# Patient Record
Sex: Female | Born: 1950 | Race: White | Hispanic: No | Marital: Married
Health system: Southern US, Community
[De-identification: ages and names within clinical notes are randomized; demographics above are authoritative.]

## PROBLEM LIST (undated history)

## (undated) DIAGNOSIS — G20 Parkinson's disease: Secondary | ICD-10-CM

## (undated) DIAGNOSIS — C801 Malignant (primary) neoplasm, unspecified: Secondary | ICD-10-CM

## (undated) DIAGNOSIS — F0280 Dementia in other diseases classified elsewhere without behavioral disturbance: Secondary | ICD-10-CM

## (undated) HISTORY — DX: Malignant (primary) neoplasm, unspecified: C80.1

## (undated) HISTORY — PX: BREAST LUMPECTOMY: SHX2

---

## 2000-08-01 ENCOUNTER — Other Ambulatory Visit: Admission: RE | Admit: 2000-08-01 | Discharge: 2000-08-01 | Payer: Self-pay | Admitting: *Deleted

## 2000-08-09 ENCOUNTER — Encounter: Payer: Self-pay | Admitting: Internal Medicine

## 2000-08-09 ENCOUNTER — Encounter: Admission: RE | Admit: 2000-08-09 | Discharge: 2000-08-09 | Payer: Self-pay | Admitting: Internal Medicine

## 2001-09-06 ENCOUNTER — Ambulatory Visit (HOSPITAL_COMMUNITY): Admission: RE | Admit: 2001-09-06 | Discharge: 2001-09-06 | Payer: Self-pay | Admitting: Internal Medicine

## 2001-09-06 ENCOUNTER — Encounter: Payer: Self-pay | Admitting: Internal Medicine

## 2001-09-10 ENCOUNTER — Encounter: Payer: Self-pay | Admitting: Internal Medicine

## 2001-09-10 ENCOUNTER — Encounter (INDEPENDENT_AMBULATORY_CARE_PROVIDER_SITE_OTHER): Payer: Self-pay | Admitting: *Deleted

## 2001-09-10 ENCOUNTER — Encounter: Admission: RE | Admit: 2001-09-10 | Discharge: 2001-09-10 | Payer: Self-pay | Admitting: Internal Medicine

## 2001-10-02 ENCOUNTER — Encounter: Admission: RE | Admit: 2001-10-02 | Discharge: 2001-10-02 | Payer: Self-pay | Admitting: Surgery

## 2001-10-02 ENCOUNTER — Encounter: Payer: Self-pay | Admitting: Surgery

## 2001-10-04 ENCOUNTER — Ambulatory Visit (HOSPITAL_BASED_OUTPATIENT_CLINIC_OR_DEPARTMENT_OTHER): Admission: RE | Admit: 2001-10-04 | Discharge: 2001-10-04 | Payer: Self-pay | Admitting: Surgery

## 2001-10-04 ENCOUNTER — Encounter: Payer: Self-pay | Admitting: Surgery

## 2001-10-04 ENCOUNTER — Encounter: Admission: RE | Admit: 2001-10-04 | Discharge: 2001-10-04 | Payer: Self-pay | Admitting: Surgery

## 2001-10-04 ENCOUNTER — Encounter (INDEPENDENT_AMBULATORY_CARE_PROVIDER_SITE_OTHER): Payer: Self-pay | Admitting: Specialist

## 2001-10-11 ENCOUNTER — Ambulatory Visit: Admission: RE | Admit: 2001-10-11 | Discharge: 2001-12-29 | Payer: Self-pay | Admitting: Radiation Oncology

## 2001-10-16 ENCOUNTER — Other Ambulatory Visit: Admission: RE | Admit: 2001-10-16 | Discharge: 2001-10-16 | Payer: Self-pay | Admitting: Obstetrics and Gynecology

## 2002-02-15 ENCOUNTER — Ambulatory Visit (HOSPITAL_COMMUNITY): Admission: RE | Admit: 2002-02-15 | Discharge: 2002-02-15 | Payer: Self-pay | Admitting: Gastroenterology

## 2002-09-11 ENCOUNTER — Encounter: Admission: RE | Admit: 2002-09-11 | Discharge: 2002-09-11 | Payer: Self-pay | Admitting: Surgery

## 2002-09-11 ENCOUNTER — Encounter: Payer: Self-pay | Admitting: Surgery

## 2003-01-03 ENCOUNTER — Other Ambulatory Visit: Admission: RE | Admit: 2003-01-03 | Discharge: 2003-01-03 | Payer: Self-pay | Admitting: Obstetrics and Gynecology

## 2003-09-19 ENCOUNTER — Encounter: Admission: RE | Admit: 2003-09-19 | Discharge: 2003-09-19 | Payer: Self-pay | Admitting: Surgery

## 2004-08-05 ENCOUNTER — Encounter: Admission: RE | Admit: 2004-08-05 | Discharge: 2004-08-05 | Payer: Self-pay | Admitting: Surgery

## 2005-09-13 ENCOUNTER — Encounter: Admission: RE | Admit: 2005-09-13 | Discharge: 2005-09-13 | Payer: Self-pay | Admitting: Surgery

## 2006-11-21 ENCOUNTER — Encounter: Admission: RE | Admit: 2006-11-21 | Discharge: 2006-11-21 | Payer: Self-pay | Admitting: Surgery

## 2007-11-26 ENCOUNTER — Encounter: Admission: RE | Admit: 2007-11-26 | Discharge: 2007-11-26 | Payer: Self-pay | Admitting: Surgery

## 2009-07-20 ENCOUNTER — Encounter: Admission: RE | Admit: 2009-07-20 | Discharge: 2009-07-20 | Payer: Self-pay | Admitting: Internal Medicine

## 2010-07-16 NOTE — Op Note (Signed)
   NAME:  Kellie Moore, FRONEK                            ACCOUNT NO.:  [id number]   MEDICAL RECORD NO.:  [id number]                   PATIENT TYPE:  AMB   LOCATION:  ENDO                                 FACILITY:  [facility]   PHYSICIAN:  Bernette Redbird, M.D.                DATE OF BIRTH:  Aug 18, 1950   DATE OF PROCEDURE:  02/15/2002  DATE OF DISCHARGE:                                 OPERATIVE REPORT   PROCEDURE:  Colonoscopy.   INDICATION:  A 60 year old female with history of breast cancer,  asymptomatic from the GI tract standpoint, for screening colonoscopy.   FINDINGS:  Normal exam to the cecum.   DESCRIPTION OF PROCEDURE:  The nature, purpose, and risks of the procedure  had been discussed with the patient who provided written consent.  Sedation  was fentanyl 100 mcg and Versed 9 mg IV without arrhythmias or desaturation.  The Olympus adjustable-tension pediatric video colonoscope was advanced with  some looping, ultimately reaching the cecum as identified by typical cecal  appearance.  Pullback was then performed.  The quality of the prep was very  good, although there was a little bit of stool film coating in proximal  colon which is not felt likely to have obscured any significant lesions.   This was a normal examination.  No polyps, cancer, colitis, vascular  malformations, or diverticular disease were observed.  Retroflexion in the  rectum was performed but could visualize the distal rectum due to overlying  valves of [place].  Careful antegrade viewing, however, disclosed no distal  rectal lesions.  Reinspection of the rectum and lower sigmoid was  unremarkable.  No biopsies were obtained.  The patient tolerated the  procedure well, and there were no apparent complications.   IMPRESSION:  Normal examination to the cecum, specifically without any  evidence of cancer or polyps.   PLAN:  Follow-up examination in five years (flexible sigmoidoscopy versus  colon) for ongoing colon  cancer screening.                                               Bernette Redbird, M.D.    RB/MEDQ  D:  02/15/2002  T:  02/15/2002  Job:  [id number]   cc:   Candyce Churn, M.D.  [address] [place]  [place] [address]  Fax: [phone]

## 2010-07-16 NOTE — Op Note (Signed)
NAME:  Kellie Moore, Kellie Moore                            ACCOUNT NO.:  [id number]   MEDICAL RECORD NO.:  [id number]                   PATIENT TYPE:  OUT   LOCATION:  MAMO                                 FACILITY:  [facility]   PHYSICIAN:  Currie Paris, M.D.           DATE OF BIRTH:  1950/12/19   DATE OF PROCEDURE:  10/04/2001  DATE OF DISCHARGE:  09/06/2001                                 OPERATIVE REPORT   OFFICE MEDICAL RECORD NUMBER:  [id number]   PREOPERATIVE DIAGNOSIS:  Ductal carcinoma in situ, left breast, lower outer  quadrant.   POSTOPERATIVE DIAGNOSIS:  Ductal carcinoma in situ, left breast, lower outer  quadrant.   OPERATION:  Needle-guided excision of right breast cancer.   SURGEON:  Currie Paris, M.D.   ANESTHESIA:  General (LMA).   CLINICAL HISTORY:  This patient is a 60 year old with a small area of  calcifications noted on mammography and core biopsy showed DCIS.  After a  lengthy discussion with the patient, we elected to proceed to needle-guided  excision.  This was a very small area, and a marking clip had been left with  the patient to help with followup localization.   DESCRIPTION OF PROCEDURE:  The patient was seen in the holding area and had  no further questions.  Films and guide wire placement were noted.  The  patient was taken to the operating room, and after satisfactory general  anesthesia had been obtained, the breast was prepped and draped.  The guide  wire entered in the lower outer quadrant almost at the inframammary fold and  tracked superiorly and medially basically toward the nipple.  I made an  elliptical incision in a radial fashion with the most lateral inferior  aspect being the guide wire and then medially toward the nipple so that I  was going to come down on the guide wire track and excise it in its  entirety.  Using cautery, I took an area of a couple centimeters around the  guide wire going down to the chest wall and to beyond the tip  medially.  We  did identify the tip right at the end of the dissection, but from the  mammograms it appeared that the abnormality was well proximal to that, and I  thought we had it well out.  There was fairly dense breast tissue along the  superior medial edge, and I excised another 1 cm of that just to be sure we  had a good margin there.  The inferior edge really appeared to be more fatty  tissue than in the breast tissue.   The area was infiltrated with some Marcaine to help with postop analgesia.  Bleeders were either coagulated or tied, and everything appeared dry.  I put  some marking clips in to mark the margins of excision and then closed in  layers with 3-0 Vicryl followed by 4-0 Monocryl subcuticular  and Steri-  Strips.  The patient tolerated the procedure well.  There were no operative  complications and all counts were correct.                                              Currie Paris, M.D.   CJS/MEDQ  D:  10/04/2001  T:  10/08/2001  Job:  [id number]   cc:   Johnella Moloney, M.D.

## 2010-12-03 ENCOUNTER — Other Ambulatory Visit: Payer: Self-pay | Admitting: Internal Medicine

## 2010-12-03 DIAGNOSIS — Z1231 Encounter for screening mammogram for malignant neoplasm of breast: Secondary | ICD-10-CM

## 2010-12-20 ENCOUNTER — Ambulatory Visit
Admission: RE | Admit: 2010-12-20 | Discharge: 2010-12-20 | Disposition: A | Discharge status: Home / Self Care | Payer: 59 | Source: Ambulatory Visit | Attending: Internal Medicine | Admitting: Internal Medicine

## 2010-12-20 DIAGNOSIS — Z1231 Encounter for screening mammogram for malignant neoplasm of breast: Secondary | ICD-10-CM

## 2012-08-14 ENCOUNTER — Other Ambulatory Visit: Payer: Self-pay | Admitting: Dermatology

## 2012-09-12 ENCOUNTER — Other Ambulatory Visit: Payer: Self-pay

## 2012-09-12 DIAGNOSIS — Z1231 Encounter for screening mammogram for malignant neoplasm of breast: Secondary | ICD-10-CM

## 2012-10-08 ENCOUNTER — Ambulatory Visit
Admission: RE | Admit: 2012-10-08 | Discharge: 2012-10-08 | Disposition: A | Discharge status: Home / Self Care | Payer: 59 | Source: Ambulatory Visit

## 2012-10-08 DIAGNOSIS — Z1231 Encounter for screening mammogram for malignant neoplasm of breast: Secondary | ICD-10-CM

## 2013-01-14 ENCOUNTER — Other Ambulatory Visit: Payer: Self-pay | Admitting: Gastroenterology

## 2013-12-13 ENCOUNTER — Other Ambulatory Visit: Payer: Self-pay

## 2013-12-13 DIAGNOSIS — Z1231 Encounter for screening mammogram for malignant neoplasm of breast: Secondary | ICD-10-CM

## 2014-01-03 ENCOUNTER — Ambulatory Visit
Admission: RE | Admit: 2014-01-03 | Discharge: 2014-01-03 | Disposition: A | Discharge status: Home / Self Care | Payer: 59 | Source: Ambulatory Visit

## 2014-01-03 DIAGNOSIS — Z1231 Encounter for screening mammogram for malignant neoplasm of breast: Secondary | ICD-10-CM

## 2015-04-15 ENCOUNTER — Other Ambulatory Visit: Payer: Self-pay

## 2015-04-15 DIAGNOSIS — Z1231 Encounter for screening mammogram for malignant neoplasm of breast: Secondary | ICD-10-CM

## 2015-04-15 DIAGNOSIS — Z853 Personal history of malignant neoplasm of breast: Secondary | ICD-10-CM

## 2015-04-30 ENCOUNTER — Ambulatory Visit
Admission: RE | Admit: 2015-04-30 | Discharge: 2015-04-30 | Disposition: A | Discharge status: Home / Self Care | Payer: PPO | Source: Ambulatory Visit

## 2015-04-30 DIAGNOSIS — Z853 Personal history of malignant neoplasm of breast: Secondary | ICD-10-CM

## 2015-04-30 DIAGNOSIS — Z1231 Encounter for screening mammogram for malignant neoplasm of breast: Secondary | ICD-10-CM | POA: Diagnosis not present

## 2015-05-05 ENCOUNTER — Other Ambulatory Visit: Payer: Self-pay | Admitting: Internal Medicine

## 2015-05-05 DIAGNOSIS — R928 Other abnormal and inconclusive findings on diagnostic imaging of breast: Secondary | ICD-10-CM

## 2015-05-11 ENCOUNTER — Ambulatory Visit
Admission: RE | Admit: 2015-05-11 | Discharge: 2015-05-11 | Disposition: A | Discharge status: Home / Self Care | Payer: PPO | Source: Ambulatory Visit | Attending: Internal Medicine | Admitting: Internal Medicine

## 2015-05-11 DIAGNOSIS — N6012 Diffuse cystic mastopathy of left breast: Secondary | ICD-10-CM | POA: Diagnosis not present

## 2015-05-11 DIAGNOSIS — R928 Other abnormal and inconclusive findings on diagnostic imaging of breast: Secondary | ICD-10-CM

## 2015-09-03 DIAGNOSIS — D225 Melanocytic nevi of trunk: Secondary | ICD-10-CM | POA: Diagnosis not present

## 2015-09-03 DIAGNOSIS — D1800 Hemangioma unspecified site: Secondary | ICD-10-CM | POA: Diagnosis not present

## 2015-09-03 DIAGNOSIS — L814 Other melanin hyperpigmentation: Secondary | ICD-10-CM | POA: Diagnosis not present

## 2015-09-03 DIAGNOSIS — Z411 Encounter for cosmetic surgery: Secondary | ICD-10-CM | POA: Diagnosis not present

## 2015-09-03 DIAGNOSIS — Z86018 Personal history of other benign neoplasm: Secondary | ICD-10-CM | POA: Diagnosis not present

## 2015-09-03 DIAGNOSIS — L821 Other seborrheic keratosis: Secondary | ICD-10-CM | POA: Diagnosis not present

## 2015-12-07 DIAGNOSIS — H5213 Myopia, bilateral: Secondary | ICD-10-CM | POA: Diagnosis not present

## 2015-12-07 DIAGNOSIS — H52203 Unspecified astigmatism, bilateral: Secondary | ICD-10-CM | POA: Diagnosis not present

## 2016-02-01 DIAGNOSIS — R7309 Other abnormal glucose: Secondary | ICD-10-CM | POA: Diagnosis not present

## 2016-02-01 DIAGNOSIS — Z1389 Encounter for screening for other disorder: Secondary | ICD-10-CM | POA: Diagnosis not present

## 2016-02-01 DIAGNOSIS — F419 Anxiety disorder, unspecified: Secondary | ICD-10-CM | POA: Diagnosis not present

## 2016-02-01 DIAGNOSIS — E441 Mild protein-calorie malnutrition: Secondary | ICD-10-CM | POA: Diagnosis not present

## 2016-02-01 DIAGNOSIS — Z1159 Encounter for screening for other viral diseases: Secondary | ICD-10-CM | POA: Diagnosis not present

## 2016-02-01 DIAGNOSIS — Z0000 Encounter for general adult medical examination without abnormal findings: Secondary | ICD-10-CM | POA: Diagnosis not present

## 2016-02-01 DIAGNOSIS — C50911 Malignant neoplasm of unspecified site of right female breast: Secondary | ICD-10-CM | POA: Diagnosis not present

## 2016-07-04 DIAGNOSIS — L235 Allergic contact dermatitis due to other chemical products: Secondary | ICD-10-CM | POA: Diagnosis not present

## 2016-07-13 DIAGNOSIS — R21 Rash and other nonspecific skin eruption: Secondary | ICD-10-CM | POA: Diagnosis not present

## 2016-07-13 DIAGNOSIS — L509 Urticaria, unspecified: Secondary | ICD-10-CM | POA: Diagnosis not present

## 2016-07-13 DIAGNOSIS — L503 Dermatographic urticaria: Secondary | ICD-10-CM | POA: Diagnosis not present

## 2016-08-01 DIAGNOSIS — E441 Mild protein-calorie malnutrition: Secondary | ICD-10-CM | POA: Diagnosis not present

## 2016-08-01 DIAGNOSIS — R21 Rash and other nonspecific skin eruption: Secondary | ICD-10-CM | POA: Diagnosis not present

## 2016-08-01 DIAGNOSIS — E782 Mixed hyperlipidemia: Secondary | ICD-10-CM | POA: Diagnosis not present

## 2016-08-01 DIAGNOSIS — F419 Anxiety disorder, unspecified: Secondary | ICD-10-CM | POA: Diagnosis not present

## 2016-08-01 DIAGNOSIS — K635 Polyp of colon: Secondary | ICD-10-CM | POA: Diagnosis not present

## 2016-08-01 DIAGNOSIS — C50911 Malignant neoplasm of unspecified site of right female breast: Secondary | ICD-10-CM | POA: Diagnosis not present

## 2016-08-08 ENCOUNTER — Other Ambulatory Visit: Payer: Self-pay | Admitting: Internal Medicine

## 2016-08-08 DIAGNOSIS — Z1231 Encounter for screening mammogram for malignant neoplasm of breast: Secondary | ICD-10-CM

## 2016-09-01 ENCOUNTER — Ambulatory Visit
Admission: RE | Admit: 2016-09-01 | Discharge: 2016-09-01 | Disposition: A | Discharge status: Home / Self Care | Payer: PPO | Source: Ambulatory Visit | Attending: Internal Medicine | Admitting: Internal Medicine

## 2016-09-01 DIAGNOSIS — Z1231 Encounter for screening mammogram for malignant neoplasm of breast: Secondary | ICD-10-CM

## 2016-09-07 DIAGNOSIS — D225 Melanocytic nevi of trunk: Secondary | ICD-10-CM | POA: Diagnosis not present

## 2016-09-07 DIAGNOSIS — D1801 Hemangioma of skin and subcutaneous tissue: Secondary | ICD-10-CM | POA: Diagnosis not present

## 2016-09-07 DIAGNOSIS — L814 Other melanin hyperpigmentation: Secondary | ICD-10-CM | POA: Diagnosis not present

## 2016-09-07 DIAGNOSIS — L821 Other seborrheic keratosis: Secondary | ICD-10-CM | POA: Diagnosis not present

## 2016-09-07 DIAGNOSIS — Z86018 Personal history of other benign neoplasm: Secondary | ICD-10-CM | POA: Diagnosis not present

## 2016-11-11 DIAGNOSIS — R21 Rash and other nonspecific skin eruption: Secondary | ICD-10-CM | POA: Diagnosis not present

## 2016-11-11 DIAGNOSIS — J3089 Other allergic rhinitis: Secondary | ICD-10-CM | POA: Diagnosis not present

## 2016-11-11 DIAGNOSIS — L299 Pruritus, unspecified: Secondary | ICD-10-CM | POA: Diagnosis not present

## 2016-11-11 DIAGNOSIS — J309 Allergic rhinitis, unspecified: Secondary | ICD-10-CM | POA: Diagnosis not present

## 2016-12-01 ENCOUNTER — Other Ambulatory Visit: Payer: Self-pay | Admitting: Internal Medicine

## 2016-12-01 DIAGNOSIS — R5383 Other fatigue: Secondary | ICD-10-CM | POA: Diagnosis not present

## 2016-12-01 DIAGNOSIS — R634 Abnormal weight loss: Secondary | ICD-10-CM

## 2016-12-01 DIAGNOSIS — E441 Mild protein-calorie malnutrition: Secondary | ICD-10-CM | POA: Diagnosis not present

## 2016-12-01 DIAGNOSIS — F419 Anxiety disorder, unspecified: Secondary | ICD-10-CM | POA: Diagnosis not present

## 2016-12-01 DIAGNOSIS — C50911 Malignant neoplasm of unspecified site of right female breast: Secondary | ICD-10-CM

## 2016-12-01 DIAGNOSIS — Z853 Personal history of malignant neoplasm of breast: Secondary | ICD-10-CM | POA: Diagnosis not present

## 2016-12-05 ENCOUNTER — Other Ambulatory Visit: Payer: PPO

## 2016-12-06 ENCOUNTER — Other Ambulatory Visit: Payer: PPO

## 2016-12-06 ENCOUNTER — Ambulatory Visit
Admission: RE | Admit: 2016-12-06 | Discharge: 2016-12-06 | Disposition: A | Discharge status: Home / Self Care | Payer: PPO | Source: Ambulatory Visit | Attending: Internal Medicine | Admitting: Internal Medicine

## 2016-12-06 DIAGNOSIS — C50911 Malignant neoplasm of unspecified site of right female breast: Secondary | ICD-10-CM

## 2016-12-06 DIAGNOSIS — R918 Other nonspecific abnormal finding of lung field: Secondary | ICD-10-CM | POA: Diagnosis not present

## 2016-12-06 DIAGNOSIS — R634 Abnormal weight loss: Secondary | ICD-10-CM | POA: Diagnosis not present

## 2016-12-06 MED ORDER — IOPAMIDOL (ISOVUE-300) INJECTION 61%
100.0000 mL | Freq: Once | INTRAVENOUS | Status: AC | PRN
  Administered 2016-12-06: 100 mL via INTRAVENOUS

## 2016-12-08 DIAGNOSIS — R634 Abnormal weight loss: Secondary | ICD-10-CM | POA: Diagnosis not present

## 2016-12-08 DIAGNOSIS — Z86010 Personal history of colonic polyps: Secondary | ICD-10-CM | POA: Diagnosis not present

## 2016-12-16 DIAGNOSIS — R634 Abnormal weight loss: Secondary | ICD-10-CM | POA: Diagnosis not present

## 2016-12-16 DIAGNOSIS — Z86010 Personal history of colonic polyps: Secondary | ICD-10-CM | POA: Diagnosis not present

## 2016-12-16 DIAGNOSIS — K633 Ulcer of intestine: Secondary | ICD-10-CM | POA: Diagnosis not present

## 2016-12-16 DIAGNOSIS — K529 Noninfective gastroenteritis and colitis, unspecified: Secondary | ICD-10-CM | POA: Diagnosis not present

## 2016-12-16 DIAGNOSIS — K2930 Chronic superficial gastritis without bleeding: Secondary | ICD-10-CM | POA: Diagnosis not present

## 2016-12-16 DIAGNOSIS — K5289 Other specified noninfective gastroenteritis and colitis: Secondary | ICD-10-CM | POA: Diagnosis not present

## 2016-12-21 DIAGNOSIS — K2930 Chronic superficial gastritis without bleeding: Secondary | ICD-10-CM | POA: Diagnosis not present

## 2016-12-21 DIAGNOSIS — K529 Noninfective gastroenteritis and colitis, unspecified: Secondary | ICD-10-CM | POA: Diagnosis not present

## 2017-03-28 DIAGNOSIS — Z23 Encounter for immunization: Secondary | ICD-10-CM | POA: Diagnosis not present

## 2017-06-14 ENCOUNTER — Other Ambulatory Visit: Payer: Self-pay | Admitting: Internal Medicine

## 2017-06-14 ENCOUNTER — Encounter: Payer: Self-pay | Admitting: Neurology

## 2017-06-14 DIAGNOSIS — R911 Solitary pulmonary nodule: Secondary | ICD-10-CM

## 2017-06-14 DIAGNOSIS — C50911 Malignant neoplasm of unspecified site of right female breast: Secondary | ICD-10-CM | POA: Diagnosis not present

## 2017-06-14 DIAGNOSIS — R413 Other amnesia: Secondary | ICD-10-CM | POA: Diagnosis not present

## 2017-06-14 DIAGNOSIS — Z1389 Encounter for screening for other disorder: Secondary | ICD-10-CM | POA: Diagnosis not present

## 2017-06-14 DIAGNOSIS — R7309 Other abnormal glucose: Secondary | ICD-10-CM | POA: Diagnosis not present

## 2017-06-14 DIAGNOSIS — E782 Mixed hyperlipidemia: Secondary | ICD-10-CM | POA: Diagnosis not present

## 2017-06-14 DIAGNOSIS — R410 Disorientation, unspecified: Secondary | ICD-10-CM | POA: Diagnosis not present

## 2017-06-14 DIAGNOSIS — I700 Atherosclerosis of aorta: Secondary | ICD-10-CM | POA: Diagnosis not present

## 2017-06-14 DIAGNOSIS — E441 Mild protein-calorie malnutrition: Secondary | ICD-10-CM | POA: Diagnosis not present

## 2017-06-14 DIAGNOSIS — R479 Unspecified speech disturbances: Secondary | ICD-10-CM | POA: Diagnosis not present

## 2017-06-14 DIAGNOSIS — Z0000 Encounter for general adult medical examination without abnormal findings: Secondary | ICD-10-CM | POA: Diagnosis not present

## 2017-06-19 ENCOUNTER — Ambulatory Visit
Admission: RE | Admit: 2017-06-19 | Discharge: 2017-06-19 | Disposition: A | Discharge status: Home / Self Care | Payer: PPO | Source: Ambulatory Visit | Attending: Internal Medicine | Admitting: Internal Medicine

## 2017-06-19 DIAGNOSIS — R911 Solitary pulmonary nodule: Secondary | ICD-10-CM | POA: Diagnosis not present

## 2017-08-09 ENCOUNTER — Encounter: Payer: Self-pay | Admitting: Neurology

## 2017-08-09 ENCOUNTER — Ambulatory Visit: Payer: PPO | Admitting: Neurology

## 2017-08-09 ENCOUNTER — Other Ambulatory Visit: Payer: Self-pay

## 2017-08-09 VITALS — BP 122/70 | HR 81 | Ht 66.0 in | Wt 96.0 lb

## 2017-08-09 DIAGNOSIS — F419 Anxiety disorder, unspecified: Secondary | ICD-10-CM

## 2017-08-09 DIAGNOSIS — R413 Other amnesia: Secondary | ICD-10-CM | POA: Diagnosis not present

## 2017-08-09 DIAGNOSIS — G20 Parkinson's disease: Secondary | ICD-10-CM

## 2017-08-09 MED ORDER — BUPROPION HCL 75 MG PO TABS: ORAL_TABLET | ORAL | 6 refills | Status: DC

## 2017-08-09 NOTE — Patient Instructions (Addendum)
1. Schedule open MRI brain with and without contrast  We have sent a referral for your OPEN MRI to [facility].  They will contact you directly to schedule your appointment.  [facility] is located at [address] [place], [place] [address].  If you need to speak with [facility] for any reason, they can be reached at [phone] \ 2. Start Wellbutrin 75mg  at night 3. Follow-up after MRI brain   RECOMMENDATIONS FOR ALL PATIENTS WITH MEMORY PROBLEMS: 1. Continue to exercise (Recommend 30 minutes of walking everyday, or 3 hours every week) 2. Increase social interactions - continue going to [place] and enjoy social gatherings with friends and family 3. Eat healthy, avoid fried foods and eat more fruits and vegetables 4. Maintain adequate blood pressure, blood sugar, and blood cholesterol level. Reducing the risk of stroke and cardiovascular disease also helps promoting better memory. 5. Avoid stressful situations. Live a simple life and avoid aggravations. Organize your time and prepare for the next day in anticipation. 6. Sleep well, avoid any interruptions of sleep and avoid any distractions in the bedroom that may interfere with adequate sleep quality 7. Avoid sugar, avoid sweets as there is a strong link between excessive sugar intake, diabetes, and cognitive impairment The Mediterranean diet has been shown to help patients reduce the risk of progressive memory disorders and reduces cardiovascular risk. This includes eating fish, eat fruits and green leafy vegetables, nuts like almonds and hazelnuts, walnuts, and also use olive oil. Avoid fast foods and fried foods as much as possible. Avoid sweets and sugar as sugar use has been linked to worsening of memory function.

## 2017-08-09 NOTE — Progress Notes (Signed)
NEUROLOGY CONSULTATION NOTE  Kellie Moore MRN: [id number] DOB: 05-Jan-1951  Referring provider: Dr. Wenda Low Primary care provider: Dr. Wenda Low  Reason for consult:  Memory change, tremors  Dear Dr Lysle Rubens:  Thank you for your kind referral of Kellie Moore for consultation of the above symptoms. Although her history is well known to you, please allow me to reiterate it for the purpose of our medical record. The patient was accompanied to the clinic by her husband who also provides collateral information. Records and images were personally reviewed where available.  HISTORY OF PRESENT ILLNESS: This is a pleasant 67 year old right-handed woman with a history of breast cancer s/p lumpectomy and radiation, in her usual state of health until 2 years ago when she started having changes in memory and personality. She feels her memory is pretty good, she has noticed some changes every once in a while with trouble getting her words out. Her husband has noticed similar changes, she would say the wrong word. He has also noted a change in behavior with significant anxiety. He feels changes may have started 10 years ago when she was diagnosed with possible breast cancer. Since then she has been hypervigilant about her health. She changed her diet and stopped eating red meat and sugar. She has lost 40 lbs in the past 10 years. She states that her mother had 3 bouts of breast cancer, up to 10 years apart. She has no prior history of significant anxiety, but now feels like she will climb out her skin. She feels like "the bodysnatchers got me, it's not me." She had a prescription for Xanax but has not taken it. Her husband reports that she used to be very active with several projects ongoing at the same time, but in the past 2 years, she has become more lethargic, just wanting to lay around the house. She used to exercise on the treadmill regularly. She states that she has significant fatigue. She used to  cook a lot, but now her husband does the cooking. They share bill responsibilities, no missed bills but her husband reports she would now pay at the last moment, which is not like her. He states she drives him crazy with questions all the time on a day to day basis. No driving concerns, but she does not feel comfortable, especially when her hand shakes. She and her husband started noticing hand tremors over the past 2 years, she feels they are mild and intermittent, he has noticed occasional times where tremor is more pronounced. She feels the shaking is inside. It does not affect eating, but her handwriting has changed. Her husband reports she used to have big, beautiful handwriting, now she has to concentrate to write and it has become smaller. She has also noticed drooling on the left side of her mouth, which is very annoying for her. Sleep feels her sleep is good, no REM behavior disorder, but her husband reports loud snoring and apnea.  She has occasional slight tingling in her right hand. No headaches, dizziness, diplopia, dysarthria/dysphagia, neck/back pain, bowel/bladder dysfunction. No changes in gait or frequent falls. She feels all this first happened when she broke out in hives, she was tested for allergies and found allergic to dust mite and mold. They found mold in their house around a year ago and had the flooring changed, so husband reports it is not an issue any longer. Her maternal uncle had tremors. She states her father had a cerebral  hemorrhage, but her husband also reports that her father had similar lethargy and had an MRI which did not find anything, but on autopsy was found to have a brain tumor. She denies any significant head injuries. She occasionally drinks alcohol, and gets the "[employer]" when she drinks.  Laboratory Data: TSH and B12 were done at PCP office, results unavailable for review   PAST MEDICAL HISTORY: Past Medical History:  Diagnosis Date  . Cancer [facility])     Breast cancer    PAST SURGICAL HISTORY: Past Surgical History:  Procedure Laterality Date  . BREAST LUMPECTOMY Right    radiation    MEDICATIONS: No current outpatient medications on file prior to visit.   No current facility-administered medications on file prior to visit.     ALLERGIES: No Known Allergies  FAMILY HISTORY: History reviewed. No pertinent family history.  SOCIAL HISTORY: Social History   Socioeconomic History  . Marital status: Married    Spouse name: Not on file  . Number of children: Not on file  . Years of education: Not on file  . Highest education level: Not on file  Occupational History  . Not on file  Social Needs  . Financial resource strain: Not on file  . Food insecurity:    Worry: Not on file    Inability: Not on file  . Transportation needs:    Medical: Not on file    Non-medical: Not on file  Tobacco Use  . Smoking status: Not on file  Substance and Sexual Activity  . Alcohol use: Not on file  . Drug use: Not on file  . Sexual activity: Not on file  Lifestyle  . Physical activity:    Days per week: Not on file    Minutes per session: Not on file  . Stress: Not on file  Relationships  . Social connections:    Talks on phone: Not on file    Gets together: Not on file    Attends religious service: Not on file    Active member of club or organization: Not on file    Attends meetings of clubs or organizations: Not on file    Relationship status: Not on file  . Intimate partner violence:    Fear of current or ex partner: Not on file    Emotionally abused: Not on file    Physically abused: Not on file    Forced sexual activity: Not on file  Other Topics Concern  . Not on file  Social History Narrative  . Not on file    REVIEW OF SYSTEMS: Constitutional: No fevers, chills, or sweats, no generalized fatigue, change in appetite Eyes: No visual changes, double vision, eye pain Ear, nose and throat: No hearing loss, ear pain,  nasal congestion, sore throat Cardiovascular: No chest pain, palpitations Respiratory:  No shortness of breath at rest or with exertion, wheezes GastrointestinaI: No nausea, vomiting, diarrhea, abdominal pain, fecal incontinence Genitourinary:  No dysuria, urinary retention or frequency Musculoskeletal:  No neck pain, back pain Integumentary: No rash, pruritus, skin lesions Neurological: as above Psychiatric: No depression, insomnia, +anxiety Endocrine: No palpitations, fatigue, diaphoresis, mood swings, change in appetite, change in weight, increased thirst Hematologic/Lymphatic:  No anemia, purpura, petechiae. Allergic/Immunologic: no itchy/runny eyes, nasal congestion, recent allergic reactions, rashes  PHYSICAL EXAM: Vitals:   08/09/17 0902  BP: 122/70  Pulse: 81  SpO2: 99%   General: No acute distress, decreased eye blink with masked facies/loss of facial expressions Head:  Normocephalic/atraumatic  Eyes: Fundoscopic exam shows bilateral sharp discs, no vessel changes, exudates, or hemorrhages Neck: supple, no paraspinal tenderness, full range of motion Back: No paraspinal tenderness Heart: regular rate and rhythm Lungs: Clear to auscultation bilaterally. Vascular: No carotid bruits. Skin/Extremities: No rash, no edema Neurological Exam: Mental status: alert and oriented to person, place, and time, no dysarthria or aphasia, Fund of knowledge is appropriate.  Recent and remote memory are intact.  Attention and concentration are normal.    Able to name objects and repeat phrases.  Montreal Cognitive Assessment  08/09/2017  Visuospatial/ Executive (0/5) 5  Naming (0/3) 3  Attention: Read list of digits (0/2) 2  Attention: Read list of letters (0/1) 1  Attention: Serial 7 subtraction starting at 100 (0/3) 3  Language: Repeat phrase (0/2) 2  Language : Fluency (0/1) 1  Abstraction (0/2) 2  Delayed Recall (0/5) 3  Orientation (0/6) 5  Total 27   Cranial nerves: CN I: not  tested CN II: pupils equal, round and reactive to light, visual fields intact, fundi unremarkable. CN III, IV, VI:  full range of motion, no nystagmus, no ptosis CN V: facial sensation intact CN VII: upper and lower face symmetric CN VIII: hearing intact to finger rub CN IX, X: gag intact, uvula midline CN XI: sternocleidomastoid and trapezius muscles intact CN XII: tongue midline Bulk & Tone: normal, no fasciculations, +bilateral cogwheeling Motor: 5/5 throughout with no pronator drift. Sensation: intact to light touch, cold, pin, vibration and joint position sense.  No extinction to double simultaneous stimulation.  Romberg test negative Deep Tendon Reflexes: brisk +2 throughout, no ankle clonus Plantar responses: downgoing bilaterally Cerebellar: no incoordination on finger to nose, heel to shin. No dysdiadochokinesia Gait: narrow-based and steady with slight decreased arm swing on left, able to tandem walk adequately. Tremor: no resting, postural, or action tremor noted in office today Good finger and foot taps, negative pull test She is noted to have micrographia writing a sentence, good spiral drawing (see attached)  IMPRESSION: This is a pleasant 67 year old right-handed woman with a history of breast cancer s/p lumpectomy and radiation, presenting with a 2 year history of tremors, cognitive, and personality changes. Her neurological exam shows some parkinsonian features with decreased eye blink with masked facies, cogwheeling, micrographia, however no tremor noted on exam today. MOCA score normal 27/30. MRI brain with and without contrast will be ordered to assess for underlying structural abnormality. We discussed the anxiety, it may relate to non-motor symptoms associated with parkinsonism, however we discussed treating with Wellbutrin 75mg  qhs as she is significantly affected by it, side effects were discussed, we may uptitrate as tolerated. She will follow-up after the MRI.   Thank  you for allowing me to participate in the care of this patient. Please do not hesitate to call for any questions or concerns.   Kellie Moore, M.D.  CC: Dr. Lysle Rubens

## 2017-08-10 ENCOUNTER — Other Ambulatory Visit: Payer: Self-pay

## 2017-08-15 DIAGNOSIS — R413 Other amnesia: Secondary | ICD-10-CM | POA: Diagnosis not present

## 2017-08-15 DIAGNOSIS — Z853 Personal history of malignant neoplasm of breast: Secondary | ICD-10-CM | POA: Diagnosis not present

## 2017-08-16 ENCOUNTER — Telehealth: Payer: Self-pay | Admitting: Neurology

## 2017-08-16 NOTE — Telephone Encounter (Signed)
Spoke with pt's husband.  Relayed that there were no signs of tumor, stroke or bleed seen on MRI.  Advised that Dr. Delice Lesch would like to see pt in office soon to go over the rest of the results.  Pt scheduled for Friday, June 21 @ 4PM

## 2017-08-18 ENCOUNTER — Encounter: Payer: Self-pay | Admitting: Neurology

## 2017-08-18 ENCOUNTER — Ambulatory Visit (INDEPENDENT_AMBULATORY_CARE_PROVIDER_SITE_OTHER): Payer: PPO | Admitting: Neurology

## 2017-08-18 VITALS — BP 100/76 | HR 76 | Ht 66.0 in | Wt 95.0 lb

## 2017-08-18 DIAGNOSIS — F419 Anxiety disorder, unspecified: Secondary | ICD-10-CM

## 2017-08-18 DIAGNOSIS — G20 Parkinson's disease: Secondary | ICD-10-CM | POA: Diagnosis not present

## 2017-08-18 NOTE — Patient Instructions (Addendum)
1. Try taking the Bupropion (Wellbutrin) every other night for a week, then increase to 1 every night 2. Follow-up in 3 months, call for any changes   RECOMMENDATIONS FOR ALL PATIENTS WITH MEMORY PROBLEMS: 1. Continue to exercise (Recommend 30 minutes of walking everyday, or 3 hours every week) 2. Increase social interactions - continue going to [place] and enjoy social gatherings with friends and family 3. Eat healthy, avoid fried foods and eat more fruits and vegetables 4. Maintain adequate blood pressure, blood sugar, and blood cholesterol level. Reducing the risk of stroke and cardiovascular disease also helps promoting better memory. 5. Avoid stressful situations. Live a simple life and avoid aggravations. Organize your time and prepare for the next day in anticipation. 6. Sleep well, avoid any interruptions of sleep and avoid any distractions in the bedroom that may interfere with adequate sleep quality 7. Avoid sugar, avoid sweets as there is a strong link between excessive sugar intake, diabetes, and cognitive impairment We discussed the Mediterranean diet, which has been shown to help patients reduce the risk of progressive memory disorders and reduces cardiovascular risk. This includes eating fish, eat fruits and green leafy vegetables, nuts like almonds and hazelnuts, walnuts, and also use olive oil. Avoid fast foods and fried foods as much as possible. Avoid sweets and sugar as sugar use has been linked to worsening of memory function.  There is always a concern of gradual progression of memory problems. If this is the case, then we may need to adjust level of care according to patient needs. Support, both to the patient and caregiver, should then be put into place.

## 2017-08-18 NOTE — Progress Notes (Signed)
NEUROLOGY FOLLOW UP OFFICE NOTE  Kellie Moore [id number] 11-30-1950  HISTORY OF PRESENT ILLNESS: I had the pleasure of seeing Kellie Moore in follow-up in the neurology clinic on 08/18/2017.  The patient was last seen on 08/09/17 for evaluation of memory and personality changes. She is again accompanied by her husband who helps supplement the history today.  Records and images were personally reviewed where available. She had an MRI brain with and without contrast done which did not show any acute changes. There was note of a small 60mm lesion within the right aspect of the pituitary gland. On her initial visit, she was noted to have some parkinsonian features with decreased eye blink with masked facies, cogwheeling, micrographia, however no tremor noted on exam. MOCA score normal 27/30. She and her husband expressed concern about significant anxiety, we started Wellbutrin 75mg  qhs. She reports feeling like a zombie after taking it only for a few days. No change in anxiety yet. She denies any heat/cold intolerance, vision changes, or galactorrhea.  HPI 08/09/2017: This is a pleasant 67 yo RH woman with a history of breast cancer s/p lumpectomy and radiation, in her usual state of health until 2 years ago when she started having changes in memory and personality. She feels her memory is pretty good, she has noticed some changes every once in a while with trouble getting her words out. Her husband has noticed similar changes, she would say the wrong word. He has also noted a change in behavior with significant anxiety. He feels changes may have started 10 years ago when she was diagnosed with possible breast cancer. Since then she has been hypervigilant about her health. She changed her diet and stopped eating red meat and sugar. She has lost 40 lbs in the past 10 years. She states that her mother had 3 bouts of breast cancer, up to 10 years apart. She has no prior history of significant anxiety, but now feels  like she will climb out her skin. She feels like "the bodysnatchers got me, it's not me." She had a prescription for Xanax but has not taken it. Her husband reports that she used to be very active with several projects ongoing at the same time, but in the past 2 years, she has become more lethargic, just wanting to lay around the house. She used to exercise on the treadmill regularly. She states that she has significant fatigue. She used to cook a lot, but now her husband does the cooking. They share bill responsibilities, no missed bills but her husband reports she would now pay at the last moment, which is not like her. He states she drives him crazy with questions all the time on a day to day basis. No driving concerns, but she does not feel comfortable, especially when her hand shakes. She and her husband started noticing hand tremors over the past 2 years, she feels they are mild and intermittent, he has noticed occasional times where tremor is more pronounced. She feels the shaking is inside. It does not affect eating, but her handwriting has changed. Her husband reports she used to have big, beautiful handwriting, now she has to concentrate to write and it has become smaller. She has also noticed drooling on the left side of her mouth, which is very annoying for her. Sleep feels her sleep is good, no REM behavior disorder, but her husband reports loud snoring and apnea.  She has occasional slight tingling in her right hand. No headaches,  dizziness, diplopia, dysarthria/dysphagia, neck/back pain, bowel/bladder dysfunction. No changes in gait or frequent falls. She feels all this first happened when she broke out in hives, she was tested for allergies and found allergic to dust mite and mold. They found mold in their house around a year ago and had the flooring changed, so husband reports it is not an issue any longer. Her maternal uncle had tremors. She states her father had a cerebral hemorrhage, but her  husband also reports that her father had similar lethargy and had an MRI which did not find anything, but on autopsy was found to have a brain tumor. She denies any significant head injuries. She occasionally drinks alcohol, and gets the "[employer]" when she drinks.  Laboratory Data: TSH and B12 were done at PCP office, results unavailable for review  PAST MEDICAL HISTORY: Past Medical History:  Diagnosis Date  . Cancer [facility])    Breast cancer    MEDICATIONS: Current Outpatient Medications on File Prior to Visit  Medication Sig Dispense Refill  . buPROPion (WELLBUTRIN) 75 MG tablet Take 1 tablet at night 30 tablet 6  . Coenzyme Q10 (CO Q 10) 10 MG CAPS Take by mouth.    . Multiple Vitamin (MULTIVITAMIN) tablet Take 1 tablet by mouth daily.     No current facility-administered medications on file prior to visit.     ALLERGIES: No Known Allergies  FAMILY HISTORY: Family History  Problem Relation Age of Onset  . Brain cancer Father     SOCIAL HISTORY: Social History   Socioeconomic History  . Marital status: Married    Spouse name: Not on file  . Number of children: Not on file  . Years of education: Not on file  . Highest education level: Not on file  Occupational History  . Not on file  Social Needs  . Financial resource strain: Not on file  . Food insecurity:    Worry: Not on file    Inability: Not on file  . Transportation needs:    Medical: Not on file    Non-medical: Not on file  Tobacco Use  . Smoking status: Never Smoker  . Smokeless tobacco: Never Used  Substance and Sexual Activity  . Alcohol use: Yes    Comment: margarita's on fridays  . Drug use: Never  . Sexual activity: Not on file  Lifestyle  . Physical activity:    Days per week: Not on file    Minutes per session: Not on file  . Stress: Not on file  Relationships  . Social connections:    Talks on phone: Not on file    Gets together: Not on file    Attends religious service: Not on  file    Active member of club or organization: Not on file    Attends meetings of clubs or organizations: Not on file    Relationship status: Not on file  . Intimate partner violence:    Fear of current or ex partner: Not on file    Emotionally abused: Not on file    Physically abused: Not on file    Forced sexual activity: Not on file  Other Topics Concern  . Not on file  Social History Narrative  . Not on file    REVIEW OF SYSTEMS: Constitutional: No fevers, chills, or sweats, no generalized fatigue, change in appetite Eyes: No visual changes, double vision, eye pain Ear, nose and throat: No hearing loss, ear pain, nasal congestion, sore throat Cardiovascular: No  chest pain, palpitations Respiratory:  No shortness of breath at rest or with exertion, wheezes GastrointestinaI: No nausea, vomiting, diarrhea, abdominal pain, fecal incontinence Genitourinary:  No dysuria, urinary retention or frequency Musculoskeletal:  No neck pain, back pain Integumentary: No rash, pruritus, skin lesions Neurological: as above Psychiatric: No depression, insomnia, anxiety Endocrine: No palpitations, fatigue, diaphoresis, mood swings, change in appetite, change in weight, increased thirst Hematologic/Lymphatic:  No anemia, purpura, petechiae. Allergic/Immunologic: no itchy/runny eyes, nasal congestion, recent allergic reactions, rashes  PHYSICAL EXAM: Vitals:   08/18/17 1609  BP: 100/76  Pulse: 76  SpO2: 96%   General: No acute distress, hypomimia with decreased eye blink Head:  Normocephalic/atraumatic Neck: supple, no paraspinal tenderness, full range of motion Heart:  Regular rate and rhythm Lungs:  Clear to auscultation bilaterally Back: No paraspinal tenderness Skin/Extremities: No rash, no edema Neurological Exam: alert and oriented to person, place, and time. No aphasia or dysarthria. Fund of knowledge is appropriate.  Recent and remote memory are intact.  Attention and concentration  are normal.    Able to name objects and repeat phrases. Cranial nerves: Pupils equal, round. No facial asymmetry. Motor: moves all extremities symmetrically. Gait narrow-based and steady with slightly decreased arm swing on left (similar to prior).  IMPRESSION: This is a pleasant 67 yo RH woman with a history of breast cancer s/p lumpectomy and radiation, who presented with a 2 year history of tremors, cognitive, and personality changes. Her neurological exam shows some parkinsonian features with decreased eye blink with masked facies, cogwheeling, micrographia, no tremor in office today. MRI brain no acute changes. There was a small 86mm lesion in the right aspect of the pituitary gland, no history of pituitary dysfunction, a follow-up 25-month MRI pituitary will be done in the future. We had an extensive discussion about diagnosis, she has parkinsonian signs, most likely Parkinson's disease. We discussed medications such as Sinemet, she is very hesitant to try any medications and has only recently started Bupropion for the anxiety. Anxiety is still a major concern, we discussed doing a slower uptitration, start taking every other night for a week, then try increasing to qhs. She will follow-up in 3 months and knows to call for any changes.   Thank you for allowing me to participate in her care.  Please do not hesitate to call for any questions or concerns.  The duration of this appointment visit was 26 minutes of face-to-face time with the patient.  Greater than 50% of this time was spent in counseling, explanation of diagnosis, planning of further management, and coordination of care.   Kellie Moore, M.D.   CC: Dr. Lysle Rubens

## 2017-08-28 ENCOUNTER — Encounter: Payer: Self-pay | Admitting: Neurology

## 2017-09-07 ENCOUNTER — Encounter

## 2017-09-07 ENCOUNTER — Ambulatory Visit: Payer: PPO | Admitting: Neurology

## 2017-09-18 DIAGNOSIS — D1801 Hemangioma of skin and subcutaneous tissue: Secondary | ICD-10-CM | POA: Diagnosis not present

## 2017-09-18 DIAGNOSIS — W57XXXA Bitten or stung by nonvenomous insect and other nonvenomous arthropods, initial encounter: Secondary | ICD-10-CM | POA: Diagnosis not present

## 2017-09-18 DIAGNOSIS — L821 Other seborrheic keratosis: Secondary | ICD-10-CM | POA: Diagnosis not present

## 2017-09-18 DIAGNOSIS — D225 Melanocytic nevi of trunk: Secondary | ICD-10-CM | POA: Diagnosis not present

## 2017-09-18 DIAGNOSIS — Z86018 Personal history of other benign neoplasm: Secondary | ICD-10-CM | POA: Diagnosis not present

## 2017-09-18 DIAGNOSIS — L814 Other melanin hyperpigmentation: Secondary | ICD-10-CM | POA: Diagnosis not present

## 2017-09-18 DIAGNOSIS — L570 Actinic keratosis: Secondary | ICD-10-CM | POA: Diagnosis not present

## 2017-11-15 ENCOUNTER — Encounter: Payer: Self-pay | Admitting: Neurology

## 2017-11-15 ENCOUNTER — Other Ambulatory Visit: Payer: Self-pay

## 2017-11-15 ENCOUNTER — Ambulatory Visit (INDEPENDENT_AMBULATORY_CARE_PROVIDER_SITE_OTHER): Payer: PPO | Admitting: Neurology

## 2017-11-15 VITALS — BP 90/46 | HR 78 | Ht 65.5 in | Wt 98.0 lb

## 2017-11-15 DIAGNOSIS — G20 Parkinson's disease: Secondary | ICD-10-CM | POA: Diagnosis not present

## 2017-11-15 DIAGNOSIS — R251 Tremor, unspecified: Secondary | ICD-10-CM

## 2017-11-15 MED ORDER — MIRTAZAPINE 7.5 MG PO TABS: ORAL_TABLET | ORAL | 11 refills | Status: DC

## 2017-11-15 MED ORDER — ATROPINE SULFATE 1 % OP SOLN: OPHTHALMIC | 12 refills | Status: DC

## 2017-11-15 MED ORDER — CARBIDOPA-LEVODOPA 25-100 MG PO TABS: ORAL_TABLET | ORAL | 6 refills | Status: DC

## 2017-11-15 NOTE — Patient Instructions (Addendum)
1. Schedule DAT scan  2. Start Sinemet 25/100mg : Take 1/2 tablet three times a day with meals for a week, then increase to 1 tablet three times a day with meals  3. Finish off your Wellbutrin, after 2-3 days, start mirtazapine 7.5mg : Take 1/2 tablet every night for 1 week, then increase to 1 tablet every night  4. For the drooling, I am giving you atropine drops. These drops can be diluted 22ml in 140ml of water and used as a mouth rinse up to three times a day. You can also try 2 drops under the tongue up to 3 times a day without diluting it if that works better for you, but sometimes it is difficult to manipulate the dropper and get it in properly.  5. Recommend increasing exercise, look into Parkinson's exercise group

## 2017-11-15 NOTE — Progress Notes (Signed)
NEUROLOGY FOLLOW UP OFFICE NOTE  [name] [id number] 01/17/1951  HISTORY OF PRESENT ILLNESS: I had the pleasure of seeing Kellie Moore in follow-up in the neurology clinic on 11/15/2017.  The patient was last seen 3 months ago for evaluation of memory and personality changes. She is again accompanied by her husband who helps supplement the history today.  On her last visit, we discussed exam findings concerning for Parkinson's disease. MOCA score normal 27/30. She and her husband expressed concern about significant anxiety, we started Wellbutrin 75mg  qhs. She wanted to hold off on Sinemet. Her husband did notice an improvement with Wellbutrin but she is still quite a bit anxious. Appetite is better. She remains drowsy all the time (even prior to starting medication). She is not a low energy person, but recently has been more tired. She has occasional tremors in her hands, left more than right. She finds that her handwriting is better when she concentrates hard. She has noticed she is more heat sensitive and gets dizzy a lot. She is also concerned about drooling, which is new.   HPI 08/09/2017: This is a pleasant 67 yo RH woman with a history of breast cancer s/p lumpectomy and radiation, in her usual state of health until 2 years ago when she started having changes in memory and personality. She feels her memory is pretty good, she has noticed some changes every once in a while with trouble getting her words out. Her husband has noticed similar changes, she would say the wrong word. He has also noted a change in behavior with significant anxiety. He feels changes may have started 10 years ago when she was diagnosed with possible breast cancer. Since then she has been hypervigilant about her health. She changed her diet and stopped eating red meat and sugar. She has lost 40 lbs in the past 10 years. She states that her mother had 3 bouts of breast cancer, up to 10 years apart. She has no prior history of  significant anxiety, but now feels like she will climb out her skin. She feels like "the bodysnatchers got me, it's not me." She had a prescription for Xanax but has not taken it. Her husband reports that she used to be very active with several projects ongoing at the same time, but in the past 2 years, she has become more lethargic, just wanting to lay around the house. She used to exercise on the treadmill regularly. She states that she has significant fatigue. She used to cook a lot, but now her husband does the cooking. They share bill responsibilities, no missed bills but her husband reports she would now pay at the last moment, which is not like her. He states she drives him crazy with questions all the time on a day to day basis. No driving concerns, but she does not feel comfortable, especially when her hand shakes. She and her husband started noticing hand tremors over the past 2 years, she feels they are mild and intermittent, he has noticed occasional times where tremor is more pronounced. She feels the shaking is inside. It does not affect eating, but her handwriting has changed. Her husband reports she used to have big, beautiful handwriting, now she has to concentrate to write and it has become smaller. She has also noticed drooling on the left side of her mouth, which is very annoying for her. Sleep feels her sleep is good, no REM behavior disorder, but her husband reports loud snoring and apnea.  She has occasional slight tingling in her right hand. No headaches, dizziness, diplopia, dysarthria/dysphagia, neck/back pain, bowel/bladder dysfunction. No changes in gait or frequent falls. She feels all this first happened when she broke out in hives, she was tested for allergies and found allergic to dust mite and mold. They found mold in their house around a year ago and had the flooring changed, so husband reports it is not an issue any longer. Her maternal uncle had tremors. She states her father  had a cerebral hemorrhage, but her husband also reports that her father had similar lethargy and had an MRI which did not find anything, but on autopsy was found to have a brain tumor. She denies any significant head injuries. She occasionally drinks alcohol, and gets the "[employer]" when she drinks.  Laboratory Data: TSH and B12 were done at PCP office, results unavailable for review MRI brain with and without contrast done 08/15/17 did not show any acute changes. There was note of a small 37mm lesion within the right aspect of the pituitary gland  PAST MEDICAL HISTORY: Past Medical History:  Diagnosis Date  . Cancer [facility])    Breast cancer    MEDICATIONS: Current Outpatient Medications on File Prior to Visit  Medication Sig Dispense Refill  . buPROPion (WELLBUTRIN) 75 MG tablet Take 1 tablet at night 30 tablet 6  . Coenzyme Q10 (CO Q 10) 10 MG CAPS Take by mouth.    . Multiple Vitamin (MULTIVITAMIN) tablet Take 1 tablet by mouth daily.     No current facility-administered medications on file prior to visit.     ALLERGIES: No Known Allergies  FAMILY HISTORY: Family History  Problem Relation Age of Onset  . Brain cancer Father     SOCIAL HISTORY: Social History   Socioeconomic History  . Marital status: Married    Spouse name: Not on file  . Number of children: Not on file  . Years of education: Not on file  . Highest education level: Not on file  Occupational History  . Not on file  Social Needs  . Financial resource strain: Not on file  . Food insecurity:    Worry: Not on file    Inability: Not on file  . Transportation needs:    Medical: Not on file    Non-medical: Not on file  Tobacco Use  . Smoking status: Never Smoker  . Smokeless tobacco: Never Used  Substance and Sexual Activity  . Alcohol use: Yes    Comment: margarita's on fridays  . Drug use: Never  . Sexual activity: Not on file  Lifestyle  . Physical activity:    Days per week: Not on file      Minutes per session: Not on file  . Stress: Not on file  Relationships  . Social connections:    Talks on phone: Not on file    Gets together: Not on file    Attends religious service: Not on file    Active member of club or organization: Not on file    Attends meetings of clubs or organizations: Not on file    Relationship status: Not on file  . Intimate partner violence:    Fear of current or ex partner: Not on file    Emotionally abused: Not on file    Physically abused: Not on file    Forced sexual activity: Not on file  Other Topics Concern  . Not on file  Social History Narrative  . Not on  file    REVIEW OF SYSTEMS: Constitutional: No fevers, chills, or sweats, no generalized fatigue, change in appetite Eyes: No visual changes, double vision, eye pain Ear, nose and throat: No hearing loss, ear pain, nasal congestion, sore throat Cardiovascular: No chest pain, palpitations Respiratory:  No shortness of breath at rest or with exertion, wheezes GastrointestinaI: No nausea, vomiting, diarrhea, abdominal pain, fecal incontinence Genitourinary:  No dysuria, urinary retention or frequency Musculoskeletal:  No neck pain, back pain Integumentary: No rash, pruritus, skin lesions Neurological: as above Psychiatric: No depression, insomnia, anxiety Endocrine: No palpitations, fatigue, diaphoresis, mood swings, change in appetite, change in weight, increased thirst Hematologic/Lymphatic:  No anemia, purpura, petechiae. Allergic/Immunologic: no itchy/runny eyes, nasal congestion, recent allergic reactions, rashes  PHYSICAL EXAM: Vitals:   11/15/17 1309  BP: (!) 90/46  Pulse: 78  SpO2: 100%   General: No acute distress, hypomimia with decreased eye blink (similar to prior) Head:  Normocephalic/atraumatic Neck: supple, no paraspinal tenderness, full range of motion Heart:  Regular rate and rhythm Lungs:  Clear to auscultation bilaterally Back: No paraspinal  tenderness Skin/Extremities: No rash, no edema Neurological Exam: alert and oriented to person, place, and time. No aphasia or dysarthria. Fund of knowledge is appropriate.  Recent and remote memory are intact.  Attention and concentration are normal.    Able to name objects and repeat phrases. Cranial nerves: Pupils equal, round and reactive to light, visual fields intact. EOMI full, no nystagmus, no ptosis. No facial asymmetry. Motor: +bilateral cogwheeling. 5/5 throughout with no pronator drift. Sensation intact to light touch. No incoordination on finger to nose testing. Decreased finger taps bilaterally. No tremor today. Gait: narrow-based and steady, fair arm swing, able to tandem walk  IMPRESSION: This is a pleasant 67 yo RH woman with a history of breast cancer s/p lumpectomy and radiation, who presented with a 2 year history of tremors, cognitive, and personality changes. Her neurological exam shows some parkinsonian features with decreased eye blink with masked facies, cogwheeling. Gait is normal today. She is now reporting drooling. MRI brain no acute changes. We discussed that she has some features of Parkinson's disease, and that a DAT scan will be helpful with the diagnosis. She did not feel Wellbutrin helped so much and would like to change medications, they are agreeable to starting mirtazapine. Side effects discussed. She is also agreeable to doing a trial of Sinemet, side effects discussed. We may uptitrate as tolerated. We discussed drooling and how atropine drops can be helpful, instructions given. She was encouraged to increase exercise and look into the Parkinson's exercise group, resources provided. She will follow-up in 4 months and knows to call for any changes.   Thank you for allowing me to participate in her care.  Please do not hesitate to call for any questions or concerns.  The duration of this appointment visit was 27 minutes of face-to-face time with the patient.  Greater  than 50% of this time was spent in counseling, explanation of diagnosis, planning of further management, and coordination of care.   Ellouise Newer, M.D.   CC: Dr. Lysle Rubens

## 2017-12-21 ENCOUNTER — Ambulatory Visit (HOSPITAL_COMMUNITY)
Admission: RE | Admit: 2017-12-21 | Discharge: 2017-12-21 | Disposition: A | Discharge status: Home / Self Care | Payer: PPO | Source: Ambulatory Visit | Attending: Neurology | Admitting: Neurology

## 2017-12-21 ENCOUNTER — Encounter (HOSPITAL_COMMUNITY): Payer: PPO

## 2017-12-21 ENCOUNTER — Other Ambulatory Visit (HOSPITAL_COMMUNITY): Payer: PPO

## 2017-12-21 DIAGNOSIS — G20 Parkinson's disease: Secondary | ICD-10-CM | POA: Diagnosis not present

## 2017-12-21 DIAGNOSIS — R251 Tremor, unspecified: Secondary | ICD-10-CM

## 2017-12-21 MED ORDER — IODINE STRONG (LUGOLS) 5 % PO SOLN
0.8000 mL | Freq: Once | ORAL | Status: AC
  Administered 2017-12-21: 0.8 mL via ORAL

## 2017-12-21 MED ORDER — IOFLUPANE I 123 185 MBQ/2.5ML IV SOLN
4.6000 | Freq: Once | INTRAVENOUS | Status: AC
  Administered 2017-12-21: 4.6 via INTRAVENOUS
  Filled 2017-12-21: qty 5

## 2017-12-22 ENCOUNTER — Telehealth: Payer: Self-pay | Admitting: Neurology

## 2017-12-22 NOTE — Telephone Encounter (Signed)
Pt had DAT scan yesterday.

## 2017-12-22 NOTE — Telephone Encounter (Signed)
Patient husband would like the rest results of the scan that was done yesterday

## 2017-12-25 NOTE — Telephone Encounter (Signed)
Left VM

## 2017-12-25 NOTE — Telephone Encounter (Signed)
Patient's husband is calling in wanting results. Please call back at [phone]. Thanks!

## 2017-12-26 NOTE — Telephone Encounter (Signed)
Discussed DAT scan report with husband, findings consistent with Parkinsons disease. She has not yet started Sinemet, start Rx as instructed. F/u as scheduled.

## 2018-03-30 ENCOUNTER — Other Ambulatory Visit: Payer: Self-pay

## 2018-03-30 ENCOUNTER — Encounter: Payer: Self-pay | Admitting: Neurology

## 2018-03-30 ENCOUNTER — Ambulatory Visit: Payer: PPO | Admitting: Neurology

## 2018-03-30 VITALS — BP 98/60 | HR 99 | Ht 65.5 in | Wt 109.0 lb

## 2018-03-30 DIAGNOSIS — G20 Parkinson's disease: Secondary | ICD-10-CM | POA: Diagnosis not present

## 2018-03-30 DIAGNOSIS — F419 Anxiety disorder, unspecified: Secondary | ICD-10-CM

## 2018-03-30 MED ORDER — MIRTAZAPINE 15 MG PO TABS: 15.0000 mg | ORAL_TABLET | Freq: Every day | ORAL | 3 refills | Status: DC

## 2018-03-30 MED ORDER — CARBIDOPA-LEVODOPA 25-100 MG PO TABS: ORAL_TABLET | ORAL | 11 refills | Status: DC

## 2018-03-30 NOTE — Patient Instructions (Addendum)
1. Increase mirtazapine to 15mg  every night. With your current bottle of mirtazapine 7.5mg , take 2 tablets every night. After you finish this, your new bottle will be for mirtazapine 15mg , take 1 tablet every night  2. Continue Sinemet 25/100mg : take 1 tablet three times a day with meals  3. Use the atropine drops in your mouth: For the drooling, I am giving you atropine drops. These drops can be diluted 27ml in 167ml of water and used as a mouth rinse up to three times a day. You can also try 2 drops under the tongue up to 3 times a day without diluting it if that works better for you, but sometimes it is difficult to manipulate the dropper and get it in properly.  4. It is recommended that your husband help/check behind with the medications  5. Follow-up in 6 months, call for any changes

## 2018-03-30 NOTE — Progress Notes (Signed)
NEUROLOGY FOLLOW UP OFFICE NOTE  Kellie Moore [id number] September 08, 1950  HISTORY OF PRESENT ILLNESS: I had the pleasure of seeing Kellie Moore in follow-up in the neurology clinic on 04/04/2018.  The patient was last seen 5 months ago for evaluation of memory and personality changes. She is again accompanied by her husband who helps supplement the history today. MOCA score 27/30 in June 2019. Since her last visit, she has had a DATscan consistent with Parkinson's disease. She is taking Sinemet 25/100mg  TID. Her husband reports she is doing better, she continues to remain active. The tremor is also better. She does not get dizzy anymore. She had significant anxiety and did not do well on Wellbutrin. She was started on Mirtazapine on her last visit and she states she is definitely better than before, that she mostly gets anxious when people are talking loudly, however her husband disagrees and states she is still pretty anxious. She remains active, they walk four times a week. No falls. She denies any headaches, dizziness, focal numbness/tingling/weakness. She was reporting a lot of drooling on her last visit and was prescribed atropine eye drops to apply inside her mouth. She misunderstood instructions and had been using them as eye drops, reporting blurred vision. Her husband repeatedly tells her that she should let him help her with the medications. She denies getting lost driving. They do bills together.  HPI 08/09/2017: This is a pleasant 68 yo RH woman with a history of breast cancer s/p lumpectomy and radiation, in her usual state of health until 2 years ago when she started having changes in memory and personality. She feels her memory is pretty good, she has noticed some changes every once in a while with trouble getting her words out. Her husband has noticed similar changes, she would say the wrong word. He has also noted a change in behavior with significant anxiety. He feels changes may have started 10  years ago when she was diagnosed with possible breast cancer. Since then she has been hypervigilant about her health. She changed her diet and stopped eating red meat and sugar. She has lost 40 lbs in the past 10 years. She states that her mother had 3 bouts of breast cancer, up to 10 years apart. She has no prior history of significant anxiety, but now feels like she will climb out her skin. She feels like "the bodysnatchers got me, it's not me." She had a prescription for Xanax but has not taken it. Her husband reports that she used to be very active with several projects ongoing at the same time, but in the past 2 years, she has become more lethargic, just wanting to lay around the house. She used to exercise on the treadmill regularly. She states that she has significant fatigue. She used to cook a lot, but now her husband does the cooking. They share bill responsibilities, no missed bills but her husband reports she would now pay at the last moment, which is not like her. He states she drives him crazy with questions all the time on a day to day basis. No driving concerns, but she does not feel comfortable, especially when her hand shakes. She and her husband started noticing hand tremors over the past 2 years, she feels they are mild and intermittent, he has noticed occasional times where tremor is more pronounced. She feels the shaking is inside. It does not affect eating, but her handwriting has changed. Her husband reports she used to have big,  beautiful handwriting, now she has to concentrate to write and it has become smaller. She has also noticed drooling on the left side of her mouth, which is very annoying for her. Sleep feels her sleep is good, no REM behavior disorder, but her husband reports loud snoring and apnea.  She has occasional slight tingling in her right hand. No headaches, dizziness, diplopia, dysarthria/dysphagia, neck/back pain, bowel/bladder dysfunction. No changes in gait or  frequent falls. She feels all this first happened when she broke out in hives, she was tested for allergies and found allergic to dust mite and mold. They found mold in their house around a year ago and had the flooring changed, so husband reports it is not an issue any longer. Her maternal uncle had tremors. She states her father had a cerebral hemorrhage, but her husband also reports that her father had similar lethargy and had an MRI which did not find anything, but on autopsy was found to have a brain tumor. She denies any significant head injuries. She occasionally drinks alcohol, and gets the "[employer]" when she drinks.  Laboratory Data: TSH and B12 were done at PCP office, results unavailable for review MRI brain with and without contrast done 08/15/17 did not show any acute changes. There was note of a small 71mm lesion within the right aspect of the pituitary gland  PAST MEDICAL HISTORY: Past Medical History:  Diagnosis Date  . Cancer [facility])    Breast cancer    MEDICATIONS: Current Outpatient Medications on File Prior to Visit  Medication Sig Dispense Refill  . atropine 1 % ophthalmic solution Use as instructed 2 mL 12  . buPROPion (WELLBUTRIN) 75 MG tablet Take 1 tablet at night 30 tablet 6  . carbidopa-levodopa (SINEMET IR) 25-100 MG tablet Take 1/2 tablet three times a day with meals for a week, then increase to 1 tablet three times a day with meals 90 tablet 6  . Coenzyme Q10 (CO Q 10) 10 MG CAPS Take by mouth.    . mirtazapine (REMERON) 7.5 MG tablet Take 1/2 tablet every night for 1 week, then increase to 1 tablet every night 30 tablet 11  . Multiple Vitamin (MULTIVITAMIN) tablet Take 1 tablet by mouth daily.     No current facility-administered medications on file prior to visit.     ALLERGIES: No Known Allergies  FAMILY HISTORY: Family History  Problem Relation Age of Onset  . Brain cancer Father     SOCIAL HISTORY: Social History   Socioeconomic History  .  Marital status: Married    Spouse name: Not on file  . Number of children: Not on file  . Years of education: Not on file  . Highest education level: Not on file  Occupational History  . Not on file  Social Needs  . Financial resource strain: Not on file  . Food insecurity:    Worry: Not on file    Inability: Not on file  . Transportation needs:    Medical: Not on file    Non-medical: Not on file  Tobacco Use  . Smoking status: Never Smoker  . Smokeless tobacco: Never Used  Substance and Sexual Activity  . Alcohol use: Yes    Comment: margarita's on fridays  . Drug use: Never  . Sexual activity: Not on file  Lifestyle  . Physical activity:    Days per week: Not on file    Minutes per session: Not on file  . Stress: Not on file  Relationships  . Social connections:    Talks on phone: Not on file    Gets together: Not on file    Attends religious service: Not on file    Active member of club or organization: Not on file    Attends meetings of clubs or organizations: Not on file    Relationship status: Not on file  . Intimate partner violence:    Fear of current or ex partner: Not on file    Emotionally abused: Not on file    Physically abused: Not on file    Forced sexual activity: Not on file  Other Topics Concern  . Not on file  Social History Narrative  . Not on file    REVIEW OF SYSTEMS: Constitutional: No fevers, chills, or sweats, no generalized fatigue, change in appetite Eyes: No visual changes, double vision, eye pain Ear, nose and throat: No hearing loss, ear pain, nasal congestion, sore throat Cardiovascular: No chest pain, palpitations Respiratory:  No shortness of breath at rest or with exertion, wheezes GastrointestinaI: No nausea, vomiting, diarrhea, abdominal pain, fecal incontinence Genitourinary:  No dysuria, urinary retention or frequency Musculoskeletal:  No neck pain, back pain Integumentary: No rash, pruritus, skin lesions Neurological: as  above Psychiatric: No depression, insomnia, anxiety Endocrine: No palpitations, fatigue, diaphoresis, mood swings, change in appetite, change in weight, increased thirst Hematologic/Lymphatic:  No anemia, purpura, petechiae. Allergic/Immunologic: no itchy/runny eyes, nasal congestion, recent allergic reactions, rashes  PHYSICAL EXAM: Vitals:   03/30/18 1452  BP: 98/60  Pulse: 99  SpO2: 98%   General: No acute distress, hypomimia with decreased eye blink (similar to prior) Head:  Normocephalic/atraumatic Neck: supple, no paraspinal tenderness, full range of motion Heart:  Regular rate and rhythm Lungs:  Clear to auscultation bilaterally Back: No paraspinal tenderness Skin/Extremities: No rash, no edema Neurological Exam: alert and oriented to person, place, and time. No aphasia or dysarthria. Fund of knowledge is appropriate.  Recent and remote memory are impaired.  Attention and concentration are normal.    Able to name objects and repeat phrases.  Montreal Cognitive Assessment  03/30/2018 08/09/2017  Visuospatial/ Executive (0/5) 2 5  Naming (0/3) 3 3  Attention: Read list of digits (0/2) 2 2  Attention: Read list of letters (0/1) 1 1  Attention: Serial 7 subtraction starting at 100 (0/3) 2 3  Language: Repeat phrase (0/2) 1 2  Language : Fluency (0/1) 0 1  Abstraction (0/2) 2 2  Delayed Recall (0/5) 1 3  Orientation (0/6) 6 5  Total 20 27   Cranial nerves: Pupils equal, round and reactive to light, visual fields intact. EOMI full, no nystagmus, no ptosis. No facial asymmetry. Motor: +bilateral cogwheeling. 5/5 throughout with no pronator drift. Sensation intact to light touch. No incoordination on finger to nose testing. Decreased finger taps bilaterally (similar to prior). No tremor today. Gait: narrow-based and steady, fair arm swing, able to tandem walk.  IMPRESSION: This is a pleasant 68 yo RH woman with a history of breast cancer s/p lumpectomy and radiation, who presented  with a 2 year history of tremors, cognitive, and personality changes. Her neurological exam shows some parkinsonian features with decreased eye blink with masked facies, cogwheeling. MOCA score today 20/30 (27/30 in June 2019). MRI brain no acute changes, DATscan consistent with Parkinson's disease. She has been tolerating Sinemet 25/100mg  TID without side effects, no tremor in office today, gait normal. She continues to have a lot of anxiety and will increase mirtazapine to 15mg  qhs.  She misunderstood atropine drops instructions for drooling, we went over them again today to apply inside her mouth, her husband will start helping with medications. Continue to monitor driving. She will follow-up in 6 months and knows to call for any changes.   Thank you for allowing me to participate in her care.  Please do not hesitate to call for any questions or concerns.  The duration of this appointment visit was 30 minutes of face-to-face time with the patient.  Greater than 50% of this time was spent in counseling, explanation of diagnosis, planning of further management, and coordination of care.   Ellouise Newer, M.D.   CC: Dr. Lysle Rubens

## 2018-06-19 ENCOUNTER — Other Ambulatory Visit: Payer: Self-pay | Admitting: Internal Medicine

## 2018-06-19 DIAGNOSIS — E782 Mixed hyperlipidemia: Secondary | ICD-10-CM | POA: Diagnosis not present

## 2018-06-19 DIAGNOSIS — Z1389 Encounter for screening for other disorder: Secondary | ICD-10-CM | POA: Diagnosis not present

## 2018-06-19 DIAGNOSIS — M8588 Other specified disorders of bone density and structure, other site: Secondary | ICD-10-CM | POA: Diagnosis not present

## 2018-06-19 DIAGNOSIS — G20 Parkinson's disease: Secondary | ICD-10-CM | POA: Diagnosis not present

## 2018-06-19 DIAGNOSIS — I700 Atherosclerosis of aorta: Secondary | ICD-10-CM | POA: Diagnosis not present

## 2018-06-19 DIAGNOSIS — Z1231 Encounter for screening mammogram for malignant neoplasm of breast: Secondary | ICD-10-CM

## 2018-06-19 DIAGNOSIS — R7303 Prediabetes: Secondary | ICD-10-CM | POA: Diagnosis not present

## 2018-06-19 DIAGNOSIS — Z0000 Encounter for general adult medical examination without abnormal findings: Secondary | ICD-10-CM | POA: Diagnosis not present

## 2018-06-19 DIAGNOSIS — F419 Anxiety disorder, unspecified: Secondary | ICD-10-CM | POA: Diagnosis not present

## 2018-06-19 DIAGNOSIS — E441 Mild protein-calorie malnutrition: Secondary | ICD-10-CM | POA: Diagnosis not present

## 2018-06-19 DIAGNOSIS — Z853 Personal history of malignant neoplasm of breast: Secondary | ICD-10-CM | POA: Diagnosis not present

## 2018-06-21 DIAGNOSIS — C50911 Malignant neoplasm of unspecified site of right female breast: Secondary | ICD-10-CM | POA: Diagnosis not present

## 2018-06-21 DIAGNOSIS — Z853 Personal history of malignant neoplasm of breast: Secondary | ICD-10-CM | POA: Diagnosis not present

## 2018-06-21 DIAGNOSIS — E782 Mixed hyperlipidemia: Secondary | ICD-10-CM | POA: Diagnosis not present

## 2018-06-21 DIAGNOSIS — R7303 Prediabetes: Secondary | ICD-10-CM | POA: Diagnosis not present

## 2018-09-12 ENCOUNTER — Ambulatory Visit: Payer: PPO | Admitting: Neurology

## 2018-09-24 ENCOUNTER — Ambulatory Visit (INDEPENDENT_AMBULATORY_CARE_PROVIDER_SITE_OTHER): Payer: PPO | Admitting: Neurology

## 2018-09-24 ENCOUNTER — Encounter: Payer: Self-pay | Admitting: Neurology

## 2018-09-24 ENCOUNTER — Other Ambulatory Visit: Payer: Self-pay

## 2018-09-24 VITALS — BP 100/70 | HR 90

## 2018-09-24 DIAGNOSIS — L219 Seborrheic dermatitis, unspecified: Secondary | ICD-10-CM | POA: Diagnosis not present

## 2018-09-24 DIAGNOSIS — L853 Xerosis cutis: Secondary | ICD-10-CM | POA: Diagnosis not present

## 2018-09-24 DIAGNOSIS — L814 Other melanin hyperpigmentation: Secondary | ICD-10-CM | POA: Diagnosis not present

## 2018-09-24 DIAGNOSIS — D225 Melanocytic nevi of trunk: Secondary | ICD-10-CM | POA: Diagnosis not present

## 2018-09-24 DIAGNOSIS — Z86018 Personal history of other benign neoplasm: Secondary | ICD-10-CM | POA: Diagnosis not present

## 2018-09-24 DIAGNOSIS — F419 Anxiety disorder, unspecified: Secondary | ICD-10-CM | POA: Diagnosis not present

## 2018-09-24 DIAGNOSIS — G20 Parkinson's disease: Secondary | ICD-10-CM | POA: Diagnosis not present

## 2018-09-24 DIAGNOSIS — L821 Other seborrheic keratosis: Secondary | ICD-10-CM | POA: Diagnosis not present

## 2018-09-24 MED ORDER — CARBIDOPA-LEVODOPA 25-100 MG PO TABS: ORAL_TABLET | ORAL | 11 refills | Status: DC

## 2018-09-24 MED ORDER — MIRTAZAPINE 15 MG PO TABS: ORAL_TABLET | ORAL | 3 refills | Status: DC

## 2018-09-24 NOTE — Patient Instructions (Signed)
1. Increase mirtazapine 15mg : take 1.5 tablets every night  2. Continue Sinemet 25/100mg : Take 1 tablet three times a day with meals  3. Consider seeing Jerome (Psychiatry and counselor) to help better with anxiety  4. Follow-up in 6 months, call for any changes

## 2018-09-24 NOTE — Progress Notes (Signed)
NEUROLOGY FOLLOW UP OFFICE NOTE  Kellie Moore [id number] Jul 17, 1950  HISTORY OF PRESENT ILLNESS: I had the pleasure of seeing Kellie Moore in follow-up in the neurology clinic on 09/24/2018.  The patient was last seen 6 months ago for evaluation of memory and personality changes. She is alone in the office today. MOCA score 10/30 in January 2020 (27/30 in June 2019). She has had a DATscan consistent with Parkinson's disease and is taking Sinemet 25/100mg  TID. She denies any tremors and states she is walking really well with no falls. She was having drooling which significantly improved with atropine drops, she only used it a few times then symptoms resolved. She has had significant anxiety. Trial of Wellbutrin did not help. She is currently on mirtazapine 15mg  qhs and states her anxiety is not good, "at a bad place now." She states she is talking too fast and trying to slow down. The pandemic situation has made her more anxious. She is still losing weight. She sleep well and does not nap during the day. She has occasional dizziness on standing. No headaches. She manages her own medications. She denies getting lost driving. She and her husband do bills together. No paranoia or hallucinations.  History on Initial Assessment 08/09/2017: This is a pleasant 68 yo RH woman with a history of breast cancer s/p lumpectomy and radiation, in her usual state of health until 2 years ago when she started having changes in memory and personality. She feels her memory is pretty good, she has noticed some changes every once in a while with trouble getting her words out. Her husband has noticed similar changes, she would say the wrong word. He has also noted a change in behavior with significant anxiety. He feels changes may have started 10 years ago when she was diagnosed with possible breast cancer. Since then she has been hypervigilant about her health. She changed her diet and stopped eating red meat and sugar. She has lost  40 lbs in the past 10 years. She states that her mother had 3 bouts of breast cancer, up to 10 years apart. She has no prior history of significant anxiety, but now feels like she will climb out her skin. She feels like "the bodysnatchers got me, it's not me." She had a prescription for Xanax but has not taken it. Her husband reports that she used to be very active with several projects ongoing at the same time, but in the past 2 years, she has become more lethargic, just wanting to lay around the house. She used to exercise on the treadmill regularly. She states that she has significant fatigue. She used to cook a lot, but now her husband does the cooking. They share bill responsibilities, no missed bills but her husband reports she would now pay at the last moment, which is not like her. He states she drives him crazy with questions all the time on a day to day basis. No driving concerns, but she does not feel comfortable, especially when her hand shakes. She and her husband started noticing hand tremors over the past 2 years, she feels they are mild and intermittent, he has noticed occasional times where tremor is more pronounced. She feels the shaking is inside. It does not affect eating, but her handwriting has changed. Her husband reports she used to have big, beautiful handwriting, now she has to concentrate to write and it has become smaller. She has also noticed drooling on the left side of her mouth,  which is very annoying for her. Sleep feels her sleep is good, no REM behavior disorder, but her husband reports loud snoring and apnea.  She has occasional slight tingling in her right hand. No headaches, dizziness, diplopia, dysarthria/dysphagia, neck/back pain, bowel/bladder dysfunction. No changes in gait or frequent falls. She feels all this first happened when she broke out in hives, she was tested for allergies and found allergic to dust mite and mold. They found mold in their house around a year ago  and had the flooring changed, so husband reports it is not an issue any longer. Her maternal uncle had tremors. She states her father had a cerebral hemorrhage, but her husband also reports that her father had similar lethargy and had an MRI which did not find anything, but on autopsy was found to have a brain tumor. She denies any significant head injuries. She occasionally drinks alcohol, and gets the "[employer]" when she drinks.  Laboratory Data: TSH and B12 were done at PCP office, results unavailable for review MRI brain with and without contrast done 08/15/17 did not show any acute changes. There was note of a small 46mm lesion within the right aspect of the pituitary gland  PAST MEDICAL HISTORY: Past Medical History:  Diagnosis Date  . Cancer [facility])    Breast cancer    MEDICATIONS: Current Outpatient Medications on File Prior to Visit  Medication Sig Dispense Refill  . carbidopa-levodopa (SINEMET IR) 25-100 MG tablet Take 1 tablet three times a day with meals 90 tablet 11  . mirtazapine (REMERON) 15 MG tablet Take 1 tablet (15 mg total) by mouth at bedtime. 90 tablet 3   No current facility-administered medications on file prior to visit.     ALLERGIES: No Known Allergies  FAMILY HISTORY: Family History  Problem Relation Age of Onset  . Brain cancer Father     SOCIAL HISTORY: Social History   Socioeconomic History  . Marital status: Married    Spouse name: Not on file  . Number of children: Not on file  . Years of education: Not on file  . Highest education level: Not on file  Occupational History  . Not on file  Social Needs  . Financial resource strain: Not on file  . Food insecurity    Worry: Not on file    Inability: Not on file  . Transportation needs    Medical: Not on file    Non-medical: Not on file  Tobacco Use  . Smoking status: Never Smoker  . Smokeless tobacco: Never Used  Substance and Sexual Activity  . Alcohol use: Yes    Comment:  margarita's on fridays  . Drug use: Never  . Sexual activity: Not on file  Lifestyle  . Physical activity    Days per week: Not on file    Minutes per session: Not on file  . Stress: Not on file  Relationships  . Social Herbalist on phone: Not on file    Gets together: Not on file    Attends religious service: Not on file    Active member of club or organization: Not on file    Attends meetings of clubs or organizations: Not on file    Relationship status: Not on file  . Intimate partner violence    Fear of current or ex partner: Not on file    Emotionally abused: Not on file    Physically abused: Not on file    Forced sexual activity:  Not on file  Other Topics Concern  . Not on file  Social History Narrative   Lives with husband two story home      Right handed      Masters degree      Retired from [employer] and then [employer] at [place]: Constitutional: No fevers, chills, or sweats, no generalized fatigue, change in appetite Eyes: No visual changes, double vision, eye pain Ear, nose and throat: No hearing loss, ear pain, nasal congestion, sore throat Cardiovascular: No chest pain, palpitations Respiratory:  No shortness of breath at rest or with exertion, wheezes GastrointestinaI: No nausea, vomiting, diarrhea, abdominal pain, fecal incontinence Genitourinary:  No dysuria, urinary retention or frequency Musculoskeletal:  No neck pain, back pain Integumentary: No rash, pruritus, skin lesions Neurological: as above Psychiatric: No depression, insomnia, anxiety Endocrine: No palpitations, fatigue, diaphoresis, mood swings, change in appetite, change in weight, increased thirst Hematologic/Lymphatic:  No anemia, purpura, petechiae. Allergic/Immunologic: no itchy/runny eyes, nasal congestion, recent allergic reactions, rashes  PHYSICAL EXAM: Vitals:   09/24/18 0930  BP: 100/70  Pulse: 90   General: No acute distress, hypomimia with  decreased eye blink (similar to prior). She is anxious with some pressured speech Head:  Normocephalic/atraumatic Skin/Extremities: No rash, no edema Neurological Exam: alert and oriented to person, place, and time. No aphasia or dysarthria. Fund of knowledge is appropriate.  Recent and remote memory are impaired.  Attention and concentration are normal.    Able to name objects and repeat phrases.  Montreal Cognitive Assessment  09/24/2018 03/30/2018 08/09/2017  Visuospatial/ Executive (0/5) 5 2 5   Naming (0/3) 3 3 3   Attention: Read list of digits (0/2) 2 2 2   Attention: Read list of letters (0/1) 0 1 1  Attention: Serial 7 subtraction starting at 100 (0/3) 2 2 3   Language: Repeat phrase (0/2) 1 1 2   Language : Fluency (0/1) 1 0 1  Abstraction (0/2) 1 2 2   Delayed Recall (0/5) 0 1 3  Orientation (0/6) 4 6 5   Total 19 20 27    Cranial nerves: Pupils equal, round and reactive to light, visual fields intact. EOMI full, no nystagmus, no ptosis. No facial asymmetry. Motor: +bilateral cogwheeling (similar to prior), no bradykinesia, good finger and foot taps, 5/5 throughout with no pronator drift. Sensation intact to light touch. No incoordination on finger to nose testing. Decreased finger taps bilaterally (similar to prior). No tremor. Gait: narrow-based and steady, good arm swing, able to tandem walk.  IMPRESSION: This is a pleasant 68 yo RH woman with a history of breast cancer s/p lumpectomy and radiation, who presented with a 2 year history of tremors, cognitive, and personality changes. Her neurological exam shows some parkinsonian features with decreased eye blink with masked facies, cogwheeling.MRI brain no acute changes, DATscan consistent with Parkinson's disease. MOCA score today 19/30 (20/30 in January 2020, 27/30 in June 2019). Continue Sinemet 25/100mg  TID. She is having more anxiety, increase mirtazapine 15mg : take 1.5 tabs qhs. She was encouraged to see Behavioral Health for anxiety, she  would like to think about it more. Continue to monitor driving. She will follow-up in 6 months and knows to call for any changes.   Thank you for allowing me to participate in her care.  Please do not hesitate to call for any questions or concerns.  The duration of this appointment visit was 30 minutes of face-to-face time with the patient.  Greater than 50% of this time was spent in  counseling, explanation of diagnosis, planning of further management, and coordination of care.   Ellouise Newer, M.D.   CC: Dr. Lysle Rubens

## 2018-09-25 ENCOUNTER — Ambulatory Visit
Admission: RE | Admit: 2018-09-25 | Discharge: 2018-09-25 | Disposition: A | Discharge status: Home / Self Care | Payer: PPO | Source: Ambulatory Visit | Attending: Internal Medicine | Admitting: Internal Medicine

## 2018-09-25 DIAGNOSIS — M8588 Other specified disorders of bone density and structure, other site: Secondary | ICD-10-CM

## 2018-09-25 DIAGNOSIS — M85852 Other specified disorders of bone density and structure, left thigh: Secondary | ICD-10-CM | POA: Diagnosis not present

## 2018-09-25 DIAGNOSIS — Z780 Asymptomatic menopausal state: Secondary | ICD-10-CM | POA: Diagnosis not present

## 2018-09-25 DIAGNOSIS — Z1231 Encounter for screening mammogram for malignant neoplasm of breast: Secondary | ICD-10-CM | POA: Diagnosis not present

## 2018-11-20 DIAGNOSIS — C50911 Malignant neoplasm of unspecified site of right female breast: Secondary | ICD-10-CM | POA: Diagnosis not present

## 2018-11-20 DIAGNOSIS — G20 Parkinson's disease: Secondary | ICD-10-CM | POA: Diagnosis not present

## 2018-11-20 DIAGNOSIS — Z23 Encounter for immunization: Secondary | ICD-10-CM | POA: Diagnosis not present

## 2018-11-20 DIAGNOSIS — I700 Atherosclerosis of aorta: Secondary | ICD-10-CM | POA: Diagnosis not present

## 2018-11-20 DIAGNOSIS — E441 Mild protein-calorie malnutrition: Secondary | ICD-10-CM | POA: Diagnosis not present

## 2019-03-20 ENCOUNTER — Ambulatory Visit: Payer: PPO | Admitting: Neurology

## 2019-04-19 ENCOUNTER — Other Ambulatory Visit: Payer: Self-pay | Admitting: Neurology

## 2019-06-19 DIAGNOSIS — I700 Atherosclerosis of aorta: Secondary | ICD-10-CM | POA: Diagnosis not present

## 2019-06-19 DIAGNOSIS — E441 Mild protein-calorie malnutrition: Secondary | ICD-10-CM | POA: Diagnosis not present

## 2019-06-19 DIAGNOSIS — M8588 Other specified disorders of bone density and structure, other site: Secondary | ICD-10-CM | POA: Diagnosis not present

## 2019-06-19 DIAGNOSIS — Z853 Personal history of malignant neoplasm of breast: Secondary | ICD-10-CM | POA: Diagnosis not present

## 2019-06-19 DIAGNOSIS — R7309 Other abnormal glucose: Secondary | ICD-10-CM | POA: Diagnosis not present

## 2019-06-19 DIAGNOSIS — Z0000 Encounter for general adult medical examination without abnormal findings: Secondary | ICD-10-CM | POA: Diagnosis not present

## 2019-06-19 DIAGNOSIS — Z1389 Encounter for screening for other disorder: Secondary | ICD-10-CM | POA: Diagnosis not present

## 2019-06-19 DIAGNOSIS — G20 Parkinson's disease: Secondary | ICD-10-CM | POA: Diagnosis not present

## 2019-06-19 DIAGNOSIS — E782 Mixed hyperlipidemia: Secondary | ICD-10-CM | POA: Diagnosis not present

## 2019-06-19 DIAGNOSIS — Z7189 Other specified counseling: Secondary | ICD-10-CM | POA: Diagnosis not present

## 2019-06-27 ENCOUNTER — Other Ambulatory Visit: Payer: Self-pay

## 2019-06-27 ENCOUNTER — Encounter: Payer: Self-pay | Admitting: Neurology

## 2019-06-27 ENCOUNTER — Ambulatory Visit: Payer: PPO | Admitting: Neurology

## 2019-06-27 VITALS — BP 119/79 | HR 78 | Resp 20 | Ht 65.5 in | Wt 101.0 lb

## 2019-06-27 DIAGNOSIS — G20 Parkinson's disease: Secondary | ICD-10-CM

## 2019-06-27 DIAGNOSIS — F419 Anxiety disorder, unspecified: Secondary | ICD-10-CM | POA: Diagnosis not present

## 2019-06-27 MED ORDER — ATROPINE SULFATE 1 % OP SOLN: OPHTHALMIC | 12 refills | Status: DC

## 2019-06-27 MED ORDER — MIRTAZAPINE 15 MG PO TABS: ORAL_TABLET | ORAL | 3 refills | Status: DC

## 2019-06-27 MED ORDER — CARBIDOPA-LEVODOPA 25-100 MG PO TABS: ORAL_TABLET | ORAL | 11 refills | Status: DC

## 2019-06-27 NOTE — Progress Notes (Signed)
NEUROLOGY FOLLOW UP OFFICE NOTE  RAIMA DORNBUSCH PI:[id number] 01/12/1951  HISTORY OF PRESENT ILLNESS: I had the pleasure of seeing Kellie Moore in follow-up in the neurology clinic on 06/27/2019.  She is again accompanied by her husband who helps supplement the history today.The patient was last seen 9 months ago for Parkinson's disease. She initially presented for evaluation of memory and personality changes. MOCA score 19/30 in 08/2018 (20/30 in 02/2018, 27/30 in June 2019). DATscan consistent with Parkinson's disease, she has been on Sinemet 25/100mg  TID without side effects. She was having significant anxiety, no response to Wellbutrin, switched to mirtazapine 15mg . We had discussed increasing dose to 1.5 tabs on last visit, but she reports taking 15mg  qhs with continued anxiety. She reports her main things are low energy and anxiety. At home she is fine, but once she goes out, she gets very anxious. Sleep is not affected, she "sleeps like a log." Her husband reports a change in her speech, she tries to hurry her speech so much that all her words are running together and this gets her really upset. He states she is more concerned about what other people think of her, rather than letting them know what is going on. He feels her depression is worse, she stays in bed most of the time, getting up for breakfast, then back in bed until 3pm. Memory is spotty, she forgets conversations from 5 minutes prior. Her husband manages finances. She manages her own medications. She has not been driving for several months, she states her husband felt it was a bad idea, she denied getting lost. She exercises regularly and denies any falls. Her husband has noticed a lot of drooling, she stopped the atropine drops for unclear reasons. He has noticed that one time food was hanging out of her mouth and she could not figure out how to bite or swallow, she states she felt frozen, then she started drooling. Her husband reports needing more  help with dressing, she got stuck in her bra one time. She denies any headaches, dizziness. Sometimes she gets shaky. She is trying to gain weight, appetite is okay. No chest or abdominal pain. No paranoia or hallucinations. No REM behavior disorder.   History on Initial Assessment 08/09/2017: This is a pleasant 69 yo RH woman with a history of breast cancer s/p lumpectomy and radiation, in her usual state of health until 2 years ago when she started having changes in memory and personality. She feels her memory is pretty good, she has noticed some changes every once in a while with trouble getting her words out. Her husband has noticed similar changes, she would say the wrong word. He has also noted a change in behavior with significant anxiety. He feels changes may have started 10 years ago when she was diagnosed with possible breast cancer. Since then she has been hypervigilant about her health. She changed her diet and stopped eating red meat and sugar. She has lost 40 lbs in the past 10 years. She states that her mother had 3 bouts of breast cancer, up to 10 years apart. She has no prior history of significant anxiety, but now feels like she will climb out her skin. She feels like "the bodysnatchers got me, it's not me." She had a prescription for Xanax but has not taken it. Her husband reports that she used to be very active with several projects ongoing at the same time, but in the past 2 years, she has become more  lethargic, just wanting to lay around the house. She used to exercise on the treadmill regularly. She states that she has significant fatigue. She used to cook a lot, but now her husband does the cooking. They share bill responsibilities, no missed bills but her husband reports she would now pay at the last moment, which is not like her. He states she drives him crazy with questions all the time on a day to day basis. No driving concerns, but she does not feel comfortable, especially when her  hand shakes. She and her husband started noticing hand tremors over the past 2 years, she feels they are mild and intermittent, he has noticed occasional times where tremor is more pronounced. She feels the shaking is inside. It does not affect eating, but her handwriting has changed. Her husband reports she used to have big, beautiful handwriting, now she has to concentrate to write and it has become smaller. She has also noticed drooling on the left side of her mouth, which is very annoying for her. Sleep feels her sleep is good, no REM behavior disorder, but her husband reports loud snoring and apnea.  She has occasional slight tingling in her right hand. No headaches, dizziness, diplopia, dysarthria/dysphagia, neck/back pain, bowel/bladder dysfunction. No changes in gait or frequent falls. She feels all this first happened when she broke out in hives, she was tested for allergies and found allergic to dust mite and mold. They found mold in their house around a year ago and had the flooring changed, so husband reports it is not an issue any longer. Her maternal uncle had tremors. She states her father had a cerebral hemorrhage, but her husband also reports that her father had similar lethargy and had an MRI which did not find anything, but on autopsy was found to have a brain tumor. She denies any significant head injuries. She occasionally drinks alcohol, and gets the "[employer]" when she drinks.  Laboratory Data: TSH and B12 were done at PCP office, results unavailable for review MRI brain with and without contrast done 08/15/17 did not show any acute changes. There was note of a small 59mm lesion within the right aspect of the pituitary gland   PAST MEDICAL HISTORY: Past Medical History:  Diagnosis Date  . Cancer [facility])    Breast cancer    MEDICATIONS: Current Outpatient Medications on File Prior to Visit  Medication Sig Dispense Refill  . carbidopa-levodopa (SINEMET IR) 25-100 MG tablet  Take 1 tablet three times a day with meals 90 tablet 11  . mirtazapine (REMERON) 15 MG tablet TAKE 1 TABLET BY MOUTH AT BEDTIME 90 tablet 0   No current facility-administered medications on file prior to visit.    ALLERGIES: No Known Allergies  FAMILY HISTORY: Family History  Problem Relation Age of Onset  . Brain cancer Father     SOCIAL HISTORY: Social History   Socioeconomic History  . Marital status: Married    Spouse name: Not on file  . Number of children: Not on file  . Years of education: Not on file  . Highest education level: Not on file  Occupational History  . Not on file  Tobacco Use  . Smoking status: Never Smoker  . Smokeless tobacco: Never Used  Substance and Sexual Activity  . Alcohol use: Yes    Comment: margarita's on fridays  . Drug use: Never  . Sexual activity: Not on file  Other Topics Concern  . Not on file  Social History  Narrative   Lives with husband two story home      Right handed      Masters degree      Retired from [employer] and then [employer] at [employer]:   . Difficulty of Paying Living Expenses:   Food Insecurity:   . Worried About Charity fundraiser in the Last Year:   . Arboriculturist in the Last Year:   Transportation Needs:   . Film/video editor (Medical):   Marland Kitchen Lack of Transportation (Non-Medical):   Physical Activity:   . Days of Exercise per Week:   . Minutes of Exercise per Session:   Stress:   . Feeling of Stress :   Social Connections:   . Frequency of Communication with Friends and Family:   . Frequency of Social Gatherings with Friends and Family:   . Attends Religious Services:   . Active Member of Clubs or Organizations:   . Attends Archivist Meetings:   Marland Kitchen Marital Status:   Intimate Partner Violence:   . Fear of Current or Ex-Partner:   . Emotionally Abused:   Marland Kitchen Physically Abused:   . Sexually Abused:     REVIEW OF  SYSTEMS: Constitutional: No fevers, chills, or sweats, no generalized fatigue, change in appetite Eyes: No visual changes, double vision, eye pain Ear, nose and throat: No hearing loss, ear pain, nasal congestion, sore throat Cardiovascular: No chest pain, palpitations Respiratory:  No shortness of breath at rest or with exertion, wheezes GastrointestinaI: No nausea, vomiting, diarrhea, abdominal pain, fecal incontinence Genitourinary:  No dysuria, urinary retention or frequency Musculoskeletal:  No neck pain, back pain Integumentary: No rash, pruritus, skin lesions Neurological: as above Psychiatric: No depression, insomnia, anxiety Endocrine: No palpitations, fatigue, diaphoresis, mood swings, change in appetite, change in weight, increased thirst Hematologic/Lymphatic:  No anemia, purpura, petechiae. Allergic/Immunologic: no itchy/runny eyes, nasal congestion, recent allergic reactions, rashes  PHYSICAL EXAM: Vitals:   06/27/19 1131  BP: 119/79  Pulse: 78  Resp: 20   General: No acute distress, anxious. +Hypomimia with decreased blink rate Head:  Normocephalic/atraumatic Skin/Extremities: No rash, no edema Neurological Exam: alert and oriented to person, place, and time. No aphasia or dysarthria. Fund of knowledge is appropriate.  Recent and remote memory are intact.  Attention and concentration are reduced. Able to name objects and repeat phrases. MMSE 26/30 MMSE - Mini Mental State Exam 06/27/2019  Orientation to time 4  Orientation to Place 5  Registration 3  Attention/ Calculation 2  Recall 3  Language- name 2 objects 2  Language- repeat 1  Language- follow 3 step command 3  Language- read & follow direction 1  Write a sentence 1  Copy design 1  Total score 26   Cranial nerves: Pupils equal, round, reactive to light. Extraocular movements intact with no nystagmus. Visual fields full. No facial asymmetry. Motor: +cogwheeling with distraction. Muscle strength 5/5  throughout with no pronator drift. Finger to nose testing intact.  Gait narrow-based and steady, good strides, with decreased arm swing. Romberg negative. +Postural instability. Decreased finger taps, good foot taps. No tremor today.    IMPRESSION: This is a pleasant 69 yo RH woman with a history of breast cancer s/p lumpectomy and radiation, who presented with a 2 year history of tremors, cognitive, and personality changes. Her neurological exam shows some parkinsonian features with decreased eye blink with masked facies, cogwheeling, decreased arm swing, postural  instability. MRI brain no acute changes, DATscan consistent with Parkinson's disease. MMSE today 26/30. Her mobility is good, however she is having more difficulties with some tasks, we will try increasing Sinemet to 1.5 tabs TID. Main concern today continues to be anxiety, we discussed increasing mirtazapine to 30mg  qhs and referral to Behavioral health for psychiatry and therapy. She will again try atropine drops for sialorrhea, if no change, we will consider Botox. She does not drive. Follow-up in 6 months, they know to call for any changes.  Thank you for allowing me to participate in her care.  Please do not hesitate to call for any questions or concerns.   Ellouise Newer, M.D.   CC: Dr. Lysle Rubens

## 2019-06-27 NOTE — Patient Instructions (Addendum)
1. Increase mirtazapine 15mg : take 2 tablets every night  2. Increase carbidopa 25/100mg : take 1.5 tablets three times a day with meals  3. Try the atropine drops: These drops can be diluted 70ml in 122ml of water and used as a mouth rinse up to three times a day. You can also try 2 drops under the tongue up to 3 times a day without diluting it if that works better for you, but sometimes it is difficult to manipulate the dropper and get it in properly.  4. Refer to [facility] for anxiety and depression  5. Follow-up in 6 months, call for any changes

## 2019-08-22 ENCOUNTER — Other Ambulatory Visit (INDEPENDENT_AMBULATORY_CARE_PROVIDER_SITE_OTHER): Payer: PPO

## 2019-08-22 ENCOUNTER — Ambulatory Visit
Admission: RE | Admit: 2019-08-22 | Discharge: 2019-08-22 | Disposition: A | Discharge status: Home / Self Care | Payer: PPO | Source: Ambulatory Visit | Attending: Neurology | Admitting: Neurology

## 2019-08-22 ENCOUNTER — Other Ambulatory Visit: Payer: Self-pay

## 2019-08-22 ENCOUNTER — Telehealth: Payer: Self-pay | Admitting: Neurology

## 2019-08-22 DIAGNOSIS — R4182 Altered mental status, unspecified: Secondary | ICD-10-CM | POA: Diagnosis not present

## 2019-08-22 LAB — CBC
HCT: 40.6 % (ref 36.0–46.0)
Hemoglobin: 13.4 g/dL (ref 12.0–15.0)
MCHC: 33 g/dL (ref 30.0–36.0)
MCV: 96.1 fl (ref 78.0–100.0)
Platelets: 224 10*3/uL (ref 150.0–400.0)
RBC: 4.22 Mil/uL (ref 3.87–5.11)
RDW: 13.8 % (ref 11.5–15.5)
WBC: 5.9 10*3/uL (ref 4.0–10.5)

## 2019-08-22 LAB — COMPREHENSIVE METABOLIC PANEL
ALT: 29 U/L (ref 0–35)
AST: 25 U/L (ref 0–37)
Albumin: 4.7 g/dL (ref 3.5–5.2)
Alkaline Phosphatase: 63 U/L (ref 39–117)
BUN: 18 mg/dL (ref 6–23)
CO2: 30 mEq/L (ref 19–32)
Calcium: 9.8 mg/dL (ref 8.4–10.5)
Chloride: 105 mEq/L (ref 96–112)
Creatinine, Ser: 0.8 mg/dL (ref 0.40–1.20)
GFR: 70.61 mL/min (ref >60.00)
Glucose, Bld: 115 mg/dL — ABNORMAL HIGH (ref 70–99)
Potassium: 4.2 mEq/L (ref 3.5–5.1)
Sodium: 137 mEq/L (ref 135–145)
Total Bilirubin: 0.5 mg/dL (ref 0.2–1.2)
Total Protein: 7.1 g/dL (ref 6.0–8.3)

## 2019-08-22 LAB — URINALYSIS
Bilirubin Urine: NEGATIVE
Hgb urine dipstick: NEGATIVE
Ketones, ur: NEGATIVE
Leukocytes,Ua: NEGATIVE
Nitrite: NEGATIVE
Specific Gravity, Urine: 1.015 (ref 1.000–1.030)
Total Protein, Urine: NEGATIVE
Urine Glucose: NEGATIVE
Urobilinogen, UA: 0.2 (ref 0.0–1.0)
pH: 6.5 (ref 5.0–8.0)

## 2019-08-22 NOTE — Telephone Encounter (Signed)
Spoke to pt husband and with This is a big change in her symptoms. Would do CBC, CMP, urinalysis, and  head CT without contrast. Orders placed in epic, husband is going to get her ready and bring her in to get the lab work and then should be able to take her to get the head CT done.

## 2019-08-22 NOTE — Telephone Encounter (Signed)
Patient's husband returned call and said, "My wife has really gone down hill since her last visit with Dr. Delice Lesch. She's hallucinating like crazy, she can't really communicate, and she's fallen twice."  Routing to triage for review and to advise on scheduling. Patient last seen 06/27/19.    You  [place], Kellie Moore 13 minutes ago (8:35 AM)  [place] Good morning!  I've just left you a voice mail to please call the office for scheduling at: [phone]. We look forward to hearing from you.  Thank you,  Lavella Hammock at [facility]     Appointment Request From: Kellie Moore  With Provider: Cameron Sprang, MD [facility] [place]]  Preferred Date Range: Any  Preferred Times: Any Time  Reason for visit: Request an Appointment  Comments: Dementia, falls

## 2019-08-22 NOTE — Telephone Encounter (Signed)
This is a big change in her symptoms. Would do CBC, CMP, urinalysis, and pls order head CT without contrast, Dx: altered mental status, recent fall. Thanks

## 2019-08-22 NOTE — Telephone Encounter (Signed)
Spoke to pt husband Mr [place] he stated that Mrs Simko changes have been Gradual at times she does not know him, he has had some falls last month she had a fall on steps she hit her head did not go to the hospital, she has slurred speech, no weakness on one side or the other, husband has to help dress her, she can take her self to the bathroom, she has to have 24/7 care, asking if any med's to help? Also they have not heard from the referral placed from behavioral health,

## 2019-08-28 ENCOUNTER — Ambulatory Visit: Payer: PPO | Admitting: Neurology

## 2019-08-28 ENCOUNTER — Encounter: Payer: Self-pay | Admitting: Neurology

## 2019-08-28 ENCOUNTER — Other Ambulatory Visit: Payer: Self-pay

## 2019-08-28 VITALS — BP 101/69 | HR 88 | Resp 20 | Ht 66.0 in | Wt 100.0 lb

## 2019-08-28 DIAGNOSIS — F419 Anxiety disorder, unspecified: Secondary | ICD-10-CM

## 2019-08-28 DIAGNOSIS — R1310 Dysphagia, unspecified: Secondary | ICD-10-CM | POA: Diagnosis not present

## 2019-08-28 DIAGNOSIS — G20 Parkinson's disease: Secondary | ICD-10-CM | POA: Diagnosis not present

## 2019-08-28 MED ORDER — DONEPEZIL HCL 10 MG PO TABS: ORAL_TABLET | ORAL | 11 refills | Status: AC

## 2019-08-28 MED ORDER — CARBIDOPA-LEVODOPA 25-100 MG PO TABS: ORAL_TABLET | ORAL | 11 refills | Status: DC

## 2019-08-28 NOTE — Progress Notes (Signed)
NEUROLOGY FOLLOW UP OFFICE NOTE  ANIKA SHORE [id number] 30-May-1950  HISTORY OF PRESENT ILLNESS: I had the pleasure of seeing Kellie Moore in follow-up in the neurology clinic on 08/28/2019.  The patient was last seen 2 months ago for Parkinson's disease. She is again accompanied by her husband who helps supplement the history today. She presents for an earlier visit due to change in symptoms. Her husband called our office to report a change in the past month, she has had 2 falls. She had a fall and hit her head, she has had slurred speech, no focal weakness. Her husband now has to help her with her clothes. She can bathe independently. She needs 24/7 care. He also reported hallucinating like crazy, she cannot communicate.  CBC, CMP, urinalysis were negative. I personally reviewed head CT without contrast done 5 days ago with no acute changes, there was mild diffuse atrophy. He reports 4 falls since 5/8, she would lose her balance. She cannot separate dreams from reality, yesterday she said there was a long line of people waiting to get in their house. They went to a friend's beach house and she kept thinking it was theirs, wishing their friends would leave. She thinks there is another Gershon Mussel that is not her husband. The atropine drops have helped significantly with drooling. He also reports more swallowing difficulties.   History on Initial Assessment 08/09/2017: This is a pleasant 69 yo RH woman with a history of breast cancer s/p lumpectomy and radiation, in her usual state of health until 2 years ago when she started having changes in memory and personality. She feels her memory is pretty good, she has noticed some changes every once in a while with trouble getting her words out. Her husband has noticed similar changes, she would say the wrong word. He has also noted a change in behavior with significant anxiety. He feels changes may have started 10 years ago when she was diagnosed with possible breast  cancer. Since then she has been hypervigilant about her health. She changed her diet and stopped eating red meat and sugar. She has lost 40 lbs in the past 10 years. She states that her mother had 3 bouts of breast cancer, up to 10 years apart. She has no prior history of significant anxiety, but now feels like she will climb out her skin. She feels like "the bodysnatchers got me, it's not me." She had a prescription for Xanax but has not taken it. Her husband reports that she used to be very active with several projects ongoing at the same time, but in the past 2 years, she has become more lethargic, just wanting to lay around the house. She used to exercise on the treadmill regularly. She states that she has significant fatigue. She used to cook a lot, but now her husband does the cooking. They share bill responsibilities, no missed bills but her husband reports she would now pay at the last moment, which is not like her. He states she drives him crazy with questions all the time on a day to day basis. No driving concerns, but she does not feel comfortable, especially when her hand shakes. She and her husband started noticing hand tremors over the past 2 years, she feels they are mild and intermittent, he has noticed occasional times where tremor is more pronounced. She feels the shaking is inside. It does not affect eating, but her handwriting has changed. Her husband reports she used to have big, beautiful  handwriting, now she has to concentrate to write and it has become smaller. She has also noticed drooling on the left side of her mouth, which is very annoying for her. Sleep feels her sleep is good, no REM behavior disorder, but her husband reports loud snoring and apnea.  She has occasional slight tingling in her right hand. No headaches, dizziness, diplopia, dysarthria/dysphagia, neck/back pain, bowel/bladder dysfunction. No changes in gait or frequent falls. She feels all this first happened when she  broke out in hives, she was tested for allergies and found allergic to dust mite and mold. They found mold in their house around a year ago and had the flooring changed, so husband reports it is not an issue any longer. Her maternal uncle had tremors. She states her father had a cerebral hemorrhage, but her husband also reports that her father had similar lethargy and had an MRI which did not find anything, but on autopsy was found to have a brain tumor. She denies any significant head injuries. She occasionally drinks alcohol, and gets the "[employer]" when she drinks.  Laboratory Data: TSH and B12 were done at PCP office, results unavailable for review MRI brain with and without contrast done 08/15/17 did not show any acute changes. There was note of a small 51mm lesion within the right aspect of the pituitary gland   PAST MEDICAL HISTORY: Past Medical History:  Diagnosis Date  . Cancer [facility])    Breast cancer    MEDICATIONS: Current Outpatient Medications on File Prior to Visit  Medication Sig Dispense Refill  . atropine 1 % ophthalmic solution For drooling,these drops can be diluted 53ml in 170ml of water and used as a mouth rinse up to three times a day. You can also try 2 drops under the tongue up to 3 times a day without diluting it if that works better for you 15 mL 12  . carbidopa-levodopa (SINEMET IR) 25-100 MG tablet Take 1.5 tablets three times a day with meals 135 tablet 11  . mirtazapine (REMERON) 15 MG tablet TAKE 2 TABLETS BY MOUTH AT BEDTIME 180 tablet 3   No current facility-administered medications on file prior to visit.    ALLERGIES: No Known Allergies  FAMILY HISTORY: Family History  Problem Relation Age of Onset  . Brain cancer Father     SOCIAL HISTORY: Social History   Socioeconomic History  . Marital status: Married    Spouse name: Not on file  . Number of children: Not on file  . Years of education: Not on file  . Highest education level: Not on  file  Occupational History  . Occupation: retired  Tobacco Use  . Smoking status: Never Smoker  . Smokeless tobacco: Never Used  Substance and Sexual Activity  . Alcohol use: Yes    Comment: margarita's on fridays  . Drug use: Never  . Sexual activity: Not on file  Other Topics Concern  . Not on file  Social History Narrative   Lives with husband two story home      Right handed      Masters degree      Retired from [employer] and then [employer] at [employer]:   . Difficulty of Paying Living Expenses:   Food Insecurity:   . Worried About Charity fundraiser in the Last Year:   . Arboriculturist in the Last Year:   Transportation Needs:   .  Lack of Transportation (Medical):   Marland Kitchen Lack of Transportation (Non-Medical):   Physical Activity:   . Days of Exercise per Week:   . Minutes of Exercise per Session:   Stress:   . Feeling of Stress :   Social Connections:   . Frequency of Communication with Friends and Family:   . Frequency of Social Gatherings with Friends and Family:   . Attends Religious Services:   . Active Member of Clubs or Organizations:   . Attends Archivist Meetings:   Marland Kitchen Marital Status:   Intimate Partner Violence:   . Fear of Current or Ex-Partner:   . Emotionally Abused:   Marland Kitchen Physically Abused:   . Sexually Abused:     PHYSICAL EXAM: Vitals:   08/28/19 1501  BP: 101/69  Pulse: 88  Resp: 20  SpO2: 98%   General: No acute distress, hypomimia/masked facies, anxious Head:  Normocephalic/atraumatic Skin/Extremities: No rash, no edema Neurological Exam: alert and oriented to person, place, and time. No aphasia or dysarthria. Fund of knowledge is appropriate.  Recent and remote memory are impaired.  Attention and concentration are normal.   Cranial nerves: Pupils equal, round, reactive to light.  Extraocular movements intact with no nystagmus. Visual fields full. No facial asymmetry.  Motor: +cogwheeling bilaterally, muscle strength 5/5 throughout with no pronator drift.  Finger to nose testing intact.  Gait: decreased arm swing on left, gait narrow based and steady. No tremor. +Postural instability   IMPRESSION: This is a pleasant 69 yo RH woman with a history of breast cancer s/p lumpectomy and radiation, who presented with a 2 year history of tremors, cognitive, and personality changes. DATscan consistent with Parkinson's disease. She presents for an urgent visit due to a change in symptoms with hallucinations, confusion, and increased falls. Head CT no acute changes, infectious workup negative. Her neurological exam again shows some parkinsonian features with decreased eye blink with masked facies, cogwheeling, decreased arm swing, postural instability. Symptoms may be due to recent increase in Sinemet, reduce back to 1 tab TID. Her husband reports more memory changes, and would like to proceed with starting Donepezil. Side effects and expectations discussed, start Donepezil 10mg  1/2 tab daily for 2 weeks, then increase to 1 tab daily. Continue mirtazapine 30mg  qhs. She will be referred to PT for balance/Parkinson's disease. Swallow evaluation will be ordered due to report of difficulty swallowing. Follow-up in 4 months, they know to call for any changes.    Thank you for allowing me to participate in her care.  Please do not hesitate to call for any questions or concerns.   Ellouise Newer, M.D.   CC: Dr. Lysle Rubens

## 2019-08-28 NOTE — Patient Instructions (Addendum)
1. Start Donepezil (Aricept) 10mg : Take 1/2 tablet daily for 2 weeks, then increase to 1 tablet daily  2. Reduce Carbidopa to 1 tab three times a day with meals  3. Continue mirtazapine 15mg : Take 2 tablets every night  4. Refer to Physical Therapy for Parkinson's disease  5. Schedule swallow evaluation  6. Follow-up in 4 months, call for any changes

## 2019-08-29 DIAGNOSIS — L989 Disorder of the skin and subcutaneous tissue, unspecified: Secondary | ICD-10-CM | POA: Diagnosis not present

## 2019-08-30 ENCOUNTER — Other Ambulatory Visit (HOSPITAL_COMMUNITY): Payer: Self-pay | Admitting: *Deleted

## 2019-08-30 DIAGNOSIS — R1310 Dysphagia, unspecified: Secondary | ICD-10-CM

## 2019-09-20 ENCOUNTER — Other Ambulatory Visit: Payer: Self-pay

## 2019-09-20 ENCOUNTER — Ambulatory Visit (HOSPITAL_COMMUNITY)
Admission: RE | Admit: 2019-09-20 | Discharge: 2019-09-20 | Disposition: A | Discharge status: Home / Self Care | Payer: PPO | Source: Ambulatory Visit | Attending: Neurology | Admitting: Neurology

## 2019-09-20 DIAGNOSIS — F419 Anxiety disorder, unspecified: Secondary | ICD-10-CM | POA: Insufficient documentation

## 2019-09-20 DIAGNOSIS — R1310 Dysphagia, unspecified: Secondary | ICD-10-CM | POA: Diagnosis not present

## 2019-09-20 DIAGNOSIS — G20 Parkinson's disease: Secondary | ICD-10-CM | POA: Insufficient documentation

## 2019-09-20 DIAGNOSIS — R05 Cough: Secondary | ICD-10-CM | POA: Diagnosis not present

## 2019-09-23 ENCOUNTER — Observation Stay (HOSPITAL_COMMUNITY): Payer: PPO

## 2019-09-23 ENCOUNTER — Observation Stay (HOSPITAL_COMMUNITY)
Admission: EM | Admit: 2019-09-23 | Discharge: 2019-09-24 | Disposition: A | Discharge status: Home / Self Care | Payer: PPO | Attending: Internal Medicine | Admitting: Internal Medicine

## 2019-09-23 ENCOUNTER — Encounter (HOSPITAL_COMMUNITY): Payer: Self-pay

## 2019-09-23 ENCOUNTER — Emergency Department (HOSPITAL_COMMUNITY): Payer: PPO

## 2019-09-23 ENCOUNTER — Telehealth: Payer: Self-pay | Admitting: Neurology

## 2019-09-23 DIAGNOSIS — I10 Essential (primary) hypertension: Secondary | ICD-10-CM | POA: Diagnosis not present

## 2019-09-23 DIAGNOSIS — R4701 Aphasia: Secondary | ICD-10-CM | POA: Diagnosis not present

## 2019-09-23 DIAGNOSIS — R4781 Slurred speech: Secondary | ICD-10-CM | POA: Diagnosis not present

## 2019-09-23 DIAGNOSIS — R29810 Facial weakness: Secondary | ICD-10-CM | POA: Diagnosis not present

## 2019-09-23 DIAGNOSIS — F0390 Unspecified dementia without behavioral disturbance: Secondary | ICD-10-CM | POA: Diagnosis not present

## 2019-09-23 DIAGNOSIS — Z20822 Contact with and (suspected) exposure to covid-19: Secondary | ICD-10-CM | POA: Diagnosis not present

## 2019-09-23 DIAGNOSIS — G20A1 Parkinson's disease without dyskinesia, without mention of fluctuations: Secondary | ICD-10-CM | POA: Diagnosis present

## 2019-09-23 DIAGNOSIS — R4182 Altered mental status, unspecified: Principal | ICD-10-CM | POA: Insufficient documentation

## 2019-09-23 DIAGNOSIS — G319 Degenerative disease of nervous system, unspecified: Secondary | ICD-10-CM | POA: Diagnosis not present

## 2019-09-23 DIAGNOSIS — G92 Toxic encephalopathy: Secondary | ICD-10-CM

## 2019-09-23 DIAGNOSIS — R404 Transient alteration of awareness: Secondary | ICD-10-CM | POA: Diagnosis not present

## 2019-09-23 DIAGNOSIS — R29818 Other symptoms and signs involving the nervous system: Secondary | ICD-10-CM | POA: Diagnosis not present

## 2019-09-23 DIAGNOSIS — G20 Parkinson's disease: Secondary | ICD-10-CM | POA: Diagnosis present

## 2019-09-23 DIAGNOSIS — Z853 Personal history of malignant neoplasm of breast: Secondary | ICD-10-CM | POA: Insufficient documentation

## 2019-09-23 DIAGNOSIS — G9340 Encephalopathy, unspecified: Secondary | ICD-10-CM | POA: Diagnosis not present

## 2019-09-23 DIAGNOSIS — I639 Cerebral infarction, unspecified: Secondary | ICD-10-CM | POA: Diagnosis not present

## 2019-09-23 DIAGNOSIS — R531 Weakness: Secondary | ICD-10-CM | POA: Diagnosis not present

## 2019-09-23 LAB — COMPREHENSIVE METABOLIC PANEL
ALT: 22 U/L (ref 0–44)
AST: 42 U/L — ABNORMAL HIGH (ref 15–41)
Albumin: 3.4 g/dL — ABNORMAL LOW (ref 3.5–5.0)
Alkaline Phosphatase: 52 U/L (ref 38–126)
Anion gap: 8 (ref 5–15)
BUN: 16 mg/dL (ref 8–23)
CO2: 22 mmol/L (ref 22–32)
Calcium: 7.8 mg/dL — ABNORMAL LOW (ref 8.9–10.3)
Chloride: 107 mmol/L (ref 98–111)
Creatinine, Ser: 0.74 mg/dL (ref 0.44–1.00)
GFR calc Af Amer: >60 mL/min (ref >60)
GFR calc non Af Amer: >60 mL/min (ref >60)
Glucose, Bld: 100 mg/dL — ABNORMAL HIGH (ref 70–99)
Potassium: 4.8 mmol/L (ref 3.5–5.1)
Sodium: 137 mmol/L (ref 135–145)
Total Bilirubin: 0.7 mg/dL (ref 0.3–1.2)
Total Protein: 5.4 g/dL — ABNORMAL LOW (ref 6.5–8.1)

## 2019-09-23 LAB — DIFFERENTIAL
Abs Immature Granulocytes: 0.03 10*3/uL (ref 0.00–0.07)
Basophils Absolute: 0 10*3/uL (ref 0.0–0.1)
Basophils Relative: 0 %
Eosinophils Absolute: 0 10*3/uL (ref 0.0–0.5)
Eosinophils Relative: 0 %
Immature Granulocytes: 1 %
Lymphocytes Relative: 19 %
Lymphs Abs: 1.2 10*3/uL (ref 0.7–4.0)
Monocytes Absolute: 0.4 10*3/uL (ref 0.1–1.0)
Monocytes Relative: 5 %
Neutro Abs: 4.8 10*3/uL (ref 1.7–7.7)
Neutrophils Relative %: 75 %

## 2019-09-23 LAB — CBC
HCT: 36.5 % (ref 36.0–46.0)
Hemoglobin: 11.9 g/dL — ABNORMAL LOW (ref 12.0–15.0)
MCH: 31.6 pg (ref 26.0–34.0)
MCHC: 32.6 g/dL (ref 30.0–36.0)
MCV: 97.1 fL (ref 80.0–100.0)
Platelets: 185 10*3/uL (ref 150–400)
RBC: 3.76 MIL/uL — ABNORMAL LOW (ref 3.87–5.11)
RDW: 13.2 % (ref 11.5–15.5)
WBC: 6.4 10*3/uL (ref 4.0–10.5)
nRBC: 0 % (ref 0.0–0.2)

## 2019-09-23 LAB — CBG MONITORING, ED
Glucose-Capillary: 102 mg/dL — ABNORMAL HIGH (ref 70–99)
Glucose-Capillary: 93 mg/dL (ref 70–99)

## 2019-09-23 LAB — PROTIME-INR
INR: 1.1 (ref 0.8–1.2)
Prothrombin Time: 13.4 seconds (ref 11.4–15.2)

## 2019-09-23 LAB — SARS CORONAVIRUS 2 BY RT PCR (HOSPITAL ORDER, PERFORMED IN ~~LOC~~ HOSPITAL LAB): SARS Coronavirus 2: NEGATIVE

## 2019-09-23 LAB — TROPONIN I (HIGH SENSITIVITY): Troponin I (High Sensitivity): 4 ng/L (ref <18)

## 2019-09-23 LAB — LACTIC ACID, PLASMA: Lactic Acid, Venous: 1.2 mmol/L (ref 0.5–1.9)

## 2019-09-23 LAB — APTT: aPTT: 26 seconds (ref 24–36)

## 2019-09-23 LAB — ETHANOL: Alcohol, Ethyl (B): <10 mg/dL (ref <10)

## 2019-09-23 MED ORDER — STROKE: EARLY STAGES OF RECOVERY BOOK: Freq: Once | Status: AC

## 2019-09-23 MED ORDER — ACETAMINOPHEN 650 MG RE SUPP: 650.0000 mg | RECTAL | Status: DC | PRN

## 2019-09-23 MED ORDER — SODIUM CHLORIDE 0.9 % IV SOLN: INTRAVENOUS | Status: DC

## 2019-09-23 MED ORDER — ENOXAPARIN SODIUM 40 MG/0.4ML ~~LOC~~ SOLN
40.0000 mg | SUBCUTANEOUS | Status: DC
  Administered 2019-09-23: 40 mg via SUBCUTANEOUS
  Filled 2019-09-23: qty 0.4

## 2019-09-23 MED ORDER — ACETAMINOPHEN 325 MG PO TABS: 650.0000 mg | ORAL_TABLET | ORAL | Status: DC | PRN

## 2019-09-23 MED ORDER — ASPIRIN 325 MG PO TABS: 325.0000 mg | ORAL_TABLET | Freq: Every day | ORAL | Status: DC

## 2019-09-23 MED ORDER — ASPIRIN 300 MG RE SUPP: 300.0000 mg | Freq: Every day | RECTAL | Status: DC

## 2019-09-23 MED ORDER — ACETAMINOPHEN 160 MG/5ML PO SOLN: 650.0000 mg | ORAL | Status: DC | PRN

## 2019-09-23 NOTE — Code Documentation (Signed)
Responded to Code Stroke called at [facility] for aphasia, [id number]. Pt arrived at 1927, NIH-13, CBG-102, CT negative. TPA not given-exam was nonfocal. Plan to admit for workup.

## 2019-09-23 NOTE — Consult Note (Addendum)
Neurology Consultation  Reason for Consult: Code stroke for aphasia Referring Physician: Dr. Dene Gentry  CC: Aphasia  History is obtained from: EMS, chart review  HPI: Kellie Moore is a 69 y.o. female past medical history of advanced parkinsonism, breast cancer, brought in for sudden onset of inability to communicate. According to EMS, the husband told her that she was last normal around 5 PM when she was still responding to some commands but soon after stopped responding to commands.  She would not mumble or moan as she usually does.  Unclear what her baseline conversational abilities are but based on the chart review and her last visit with Dr. Ellouise Newer, who is her outpatient neurologist-she was having extreme trouble maintaining her balance, multiple hallucinations, slurred speech, difficulty swallowing to the point where she was requiring formal swallow evaluation. Patient was unable to provide any history.  Husband was on his way and could not be reached at the time of the emergent evaluation. Patient was evaluated at the ER bridge, taken for noncontrast head CT which was unremarkable for acute process. No TPA was given due to nonfocal exam. Her baseline modified Rankin score is 4-5 hence she is not a candidate for mechanical thrombectomy and vessel studies were not performed.   LKW: 5 PM on 09/23/2019 tpa given?: no, nonfocal exam Premorbid modified Rankin scale (mRS):4-5  ROS: Unable to obtain due to her altered mentation  Past Medical History:  Diagnosis Date  . Cancer [facility])    Breast cancer      Family History  Problem Relation Age of Onset  . Brain cancer Father     Social History:   reports that she has never smoked. She has never used smokeless tobacco. She reports current alcohol use. She reports that she does not use drugs.  Medications No current facility-administered medications for this encounter.  Current Outpatient Medications:  .  atropine 1 %  ophthalmic solution, For drooling,these drops can be diluted 49ml in 158ml of water and used as a mouth rinse up to three times a day. You can also try 2 drops under the tongue up to 3 times a day without diluting it if that works better for you, Disp: 15 mL, Rfl: 12 .  carbidopa-levodopa (SINEMET IR) 25-100 MG tablet, Take 1 tablet three times a day with meals, Disp: 90 tablet, Rfl: 11 .  donepezil (ARICEPT) 10 MG tablet, Take 1/2 tablet daily for 2 weeks, then increase to 1 tablet daily, Disp: 30 tablet, Rfl: 11 .  mirtazapine (REMERON) 15 MG tablet, TAKE 2 TABLETS BY MOUTH AT BEDTIME, Disp: 180 tablet, Rfl: 3  Exam: Current vital signs: Ht 5\' 6"  (1.676 m)   Wt (!) 43.3 kg   SpO2 98%   BMI 15.41 kg/m  Vital signs in last 24 hours: SpO2:  [98 %] 98 % (07/26 1946) Weight:  [43.3 kg] 43.3 kg (07/26 1950) General: Awake, alert, has an anxious look on her face.  Cachectic looking. HEENT: Normocephalic atraumatic, dried food in her mouth. CVS: Regular rhythm Respiratory: Breathing well saturating well on room air Extremities warm well perfused with no edema Neurological exam She is awake, alert.  Does not follow any commands. She is able to track the examiner. She is able to make some sounds but is nonverbal essentially. Cranial nerves: Pupils equal round reactive to light, extraocular movements appear unrestricted based on tracking the examiner but she does not follow commands, blinks to threat from both sides, face appears symmetric  but she does not smile. Motor exam: She has increased tone in bilateral upper extremities.  They seem to have symmetric strength.  Both lower extremities also appear to have increased tone and symmetric strength and she is able to move them spontaneously. Sensory exam: Grimaces to noxious stimulation Unable to assess coordination NIH stroke scale 1a Level of Conscious.: 0 1b LOC Questions: 2 1c LOC Commands: 2 2 Best Gaze: 0 3 Visual: 0 4 Facial Palsy:  0 5a Motor Arm - left: 0 5b Motor Arm - Right: 0 6a Motor Leg - Left: 2 6b Motor Leg - Right: 2 7 Limb Ataxia: 0 8 Sensory: 0 9 Best Language: 3 10 Dysarthria: 2 11 Extinct. and Inatten.: 0 TOTAL: 13   Labs I have reviewed labs in epic and the results pertinent to this consultation are:  CBC    Component Value Date/Time   WBC 6.4 09/23/2019 1956   RBC 3.76 (L) 09/23/2019 1956   HGB 11.9 (L) 09/23/2019 1956   HCT 36.5 09/23/2019 1956   PLT 185 09/23/2019 1956   MCV 97.1 09/23/2019 1956   MCH 31.6 09/23/2019 1956   MCHC 32.6 09/23/2019 1956   RDW 13.2 09/23/2019 1956   LYMPHSABS 1.2 09/23/2019 1956   MONOABS 0.4 09/23/2019 1956   EOSABS 0.0 09/23/2019 1956   BASOSABS 0.0 09/23/2019 1956    CMP     Component Value Date/Time   NA 137 09/23/2019 1956   K 4.8 09/23/2019 1956   CL 107 09/23/2019 1956   CO2 22 09/23/2019 1956   GLUCOSE 100 (H) 09/23/2019 1956   BUN 16 09/23/2019 1956   CREATININE 0.74 09/23/2019 1956   CALCIUM 7.8 (L) 09/23/2019 1956   PROT 5.4 (L) 09/23/2019 1956   ALBUMIN 3.4 (L) 09/23/2019 1956   AST 42 (H) 09/23/2019 1956   ALT 22 09/23/2019 1956   ALKPHOS 52 09/23/2019 1956   BILITOT 0.7 09/23/2019 1956   GFRNONAA >60 09/23/2019 1956   GFRAA >60 09/23/2019 1956   Imaging I have reviewed the images obtained:  CT-scan of the brain-aspects 10, no bleed  Assessment: 69 year old past history of advanced parkinsonism, breast cancer, brought in for sudden onset of inability to communicate and talk. Unclear what her baseline communication status is to begin with but based on her neurological notes from couple months ago, she appears to have been having a lot of trouble talking, with slurred speech as well as hallucinations and unresponsiveness during the times of hallucinations. She is also been having a lot of trouble with dysphagia and swallowing troubles for which she is being worked up as an outpatient. Her examination to me here appeared to be  nonfocal and appeared to be more consistent with someone with AN advanced neurodegenerative process. Hence she was not given IV TPA. Her baseline modified Rankin score is very poor for her to be considered for endovascular evaluation, hence head and neck vessel studies were not performed. Current differentials include toxic metabolic encephalopathy, worsening baseline cognitive deficits, advanced Parkinson's, evaluate for aspiration pneumonia versus UTI.  Recommendations: I would recommend observation in the hospital. Check labs to include urinalysis and other diagnoses to include chest x-ray. I would obtain an EEG in the morning to rule out any evidence of underlying seizure activity. No antiepileptics for now Continue home Sinemet. MR brain without contrast PT OT Formal speech evaluation-keep n.p.o. until cleared. Might need social work consult for placement. Plan relayed to Dr. Francia Greaves in the ER. Neurology will follow. -- Weldon Picking  Rory Percy, MD [facility] Pager: [phone] If 7pm to 7am, please call on call as listed on AMION.  CRITICAL CARE ATTESTATION Performed by: Amie Portland, MD Total critical care time: 45 minutes Critical care time was exclusive of separately billable procedures and treating other patients and/or supervising APPs/Residents/Students Critical care was necessary to treat or prevent imminent or life-threatening deterioration due to strokelike symptoms, toxic metabolic encephalopathy. This patient is critically ill and at significant risk for neurological worsening and/or death and care requires constant monitoring. Critical care was time spent personally by me on the following activities: development of treatment plan with patient and/or surrogate as well as nursing, discussions with consultants, evaluation of patient's response to treatment, examination of patient, obtaining history from patient or surrogate, ordering and performing treatments and  interventions, ordering and review of laboratory studies, ordering and review of radiographic studies, pulse oximetry, re-evaluation of patient's condition, participation in multidisciplinary rounds and medical decision making of high complexity in the care of this patient.

## 2019-09-23 NOTE — Telephone Encounter (Signed)
Patient called in and left message with Access Nurse. Patient's wife had a swallow study. Some recommendations were made, but they were advised to see a speech therapist, but have never heard from one. The patient is choking on her food, because she cannot swallow.

## 2019-09-23 NOTE — Telephone Encounter (Signed)
Pt husband stated that his wife is getting worse, she is shaking a lot more. It is hard for her to swallow food, they would like to know what stage is her Parkinson's in? Also would like to know what does the sleep study say? Pt husband also up sent that he has not heard from any of the therapies (physical or speech), also hasn't heard from behavior health, I will put in new referral orders, also asking about an order for a dietitian?

## 2019-09-23 NOTE — ED Notes (Signed)
Attempted report 

## 2019-09-23 NOTE — ED Provider Notes (Signed)
[facility] EMERGENCY DEPARTMENT Provider Note   CSN: [id number] Arrival date & time: 09/23/19  1927  An emergency department physician performed an initial assessment on this suspected stroke patient at 53.  History Chief Complaint  Patient presents with  . Code Stroke    Kellie Moore is a 69 y.o. female.  69 year old female with prior medical history detailed below presents for evaluation of mental status.  Patient with acute onset of aphasia.  Patient was called as a code stroke by EMS.  Neurology team has already evaluated patient prior to ED provider evaluation.  Patient has been deemed to be not a candidate for acute neuro intervention.  Patient with advanced Parkinson's.  Patient is non-verbal - this is a remarkable change versus her baseline.  Level 5 caveat secondary to non-verbal state.  The history is provided by the patient and the EMS personnel.  Altered Mental Status Presenting symptoms: partial responsiveness   Severity:  Moderate Most recent episode:  Today Episode history:  Single Timing:  Constant Progression:  Unchanged Chronicity:  New      Past Medical History:  Diagnosis Date  . Cancer [facility])    Breast cancer    There are no problems to display for this patient.   Past Surgical History:  Procedure Laterality Date  . BREAST LUMPECTOMY Right    radiation     OB History   No obstetric history on file.     Family History  Problem Relation Age of Onset  . Brain cancer Father     Social History   Tobacco Use  . Smoking status: Never Smoker  . Smokeless tobacco: Never Used  Substance Use Topics  . Alcohol use: Yes    Comment: margarita's on fridays  . Drug use: Never    Home Medications Prior to Admission medications   Medication Sig Start Date End Date Taking? Authorizing Provider  atropine 1 % ophthalmic solution For drooling,these drops can be diluted 31ml in 18ml of water and used as a mouth rinse up to  three times a day. You can also try 2 drops under the tongue up to 3 times a day without diluting it if that works better for you Patient taking differently: Place 1-2 drops under the tongue See admin instructions. "For drooling, these drops can be diluted 1 ml in 100 ml's of water and used as a mouth rinse up to three times a day. You can also try 2 drops under the tongue up to 3 times a day without diluting it if that works better for you." 06/27/19  Yes Cameron Sprang, MD  bisacodyl (BISACODYL) 5 MG EC tablet Take 5 mg by mouth at bedtime as needed for mild constipation or moderate constipation.   Yes [provider]  calcium carbonate (TUMS EX) 750 MG chewable tablet Chew 1 tablet by mouth as needed for heartburn (or reflux).   Yes [provider]  carbidopa-levodopa (SINEMET IR) 25-100 MG tablet Take 1 tablet three times a day with meals Patient taking differently: Take 1 tablet by mouth See admin instructions. Take 1 tablet by mouth three times a day with meals- 9:30 AM, 1 PM, and 6 PM 08/28/19  Yes Cameron Sprang, MD  donepezil (ARICEPT) 10 MG tablet Take 1/2 tablet daily for 2 weeks, then increase to 1 tablet daily Patient taking differently: Take 10 mg by mouth in the morning.  08/28/19  Yes Cameron Sprang, MD  mirtazapine (REMERON) 15 MG  tablet TAKE 2 TABLETS BY MOUTH AT BEDTIME Patient taking differently: Take 30 mg by mouth at bedtime.  06/27/19  Yes Cameron Sprang, MD  Multiple Vitamins-Minerals (ONE-A-DAY WOMENS 50 PLUS) TABS Take 1 tablet by mouth daily with breakfast.   Yes [provider]  oxymetazoline (AFRIN) 0.05 % nasal spray Place 1 spray into both nostrils 2 (two) times daily as needed for congestion.   Yes [provider]  oxymetazoline (VICKS SINEX SEVERE DECONGEST) 0.05 % nasal spray Place 1 spray into both nostrils 2 (two) times daily as needed for congestion.   Yes [provider]    Allergies    Other  Review of Systems     Review of Systems  Unable to perform ROS: Acuity of condition    Physical Exam Updated Vital Signs Ht 5\' 6"  (1.676 m)   Wt (!) 43.3 kg   SpO2 98%   BMI 15.41 kg/m   Physical Exam Vitals and nursing note reviewed.  Constitutional:      General: She is not in acute distress.    Appearance: She is well-developed.  HENT:     Head: Normocephalic and atraumatic.  Eyes:     Conjunctiva/sclera: Conjunctivae normal.     Pupils: Pupils are equal, round, and reactive to light.  Cardiovascular:     Rate and Rhythm: Normal rate and regular rhythm.     Heart sounds: Normal heart sounds.  Pulmonary:     Effort: Pulmonary effort is normal. No respiratory distress.     Breath sounds: Normal breath sounds.  Abdominal:     General: There is no distension.     Palpations: Abdomen is soft.     Tenderness: There is no abdominal tenderness.  Musculoskeletal:        General: No deformity. Normal range of motion.     Cervical back: Normal range of motion and neck supple.  Skin:    General: Skin is warm and dry.  Neurological:     General: No focal deficit present.     Mental Status: She is alert.     Comments: Aphasic   No focal deficit   Parkinsonian tremor present        ED Results / Procedures / Treatments   Labs (all labs ordered are listed, but only abnormal results are displayed) Labs Reviewed  CBC - Abnormal; Notable for the following components:      Result Value   RBC 3.76 (*)    Hemoglobin 11.9 (*)    All other components within normal limits  COMPREHENSIVE METABOLIC PANEL - Abnormal; Notable for the following components:   Glucose, Bld 100 (*)    Calcium 7.8 (*)    Total Protein 5.4 (*)    Albumin 3.4 (*)    AST 42 (*)    All other components within normal limits  CBG MONITORING, ED - Abnormal; Notable for the following components:   Glucose-Capillary 102 (*)    All other components within normal limits  SARS CORONAVIRUS 2 BY RT PCR (HOSPITAL ORDER, [place] [facility])  ETHANOL  PROTIME-INR  APTT  DIFFERENTIAL  LACTIC ACID, PLASMA  RAPID URINE DRUG SCREEN, HOSP PERFORMED  URINALYSIS, ROUTINE W REFLEX MICROSCOPIC  LACTIC ACID, PLASMA  I-STAT CHEM 8, ED  TROPONIN I (HIGH SENSITIVITY)    EKG EKG Interpretation  Date/Time:  Monday September 23 2019 19:55:04 EDT Ventricular Rate:  79 PR Interval:    QRS Duration: 106 QT Interval:  392  QTC Calculation: 450 R Axis:   135 Text Interpretation: Sinus rhythm Short PR interval Probable anterior infarct, age indeterminate Confirmed by Dene Gentry [phone]) on 09/23/2019 8:05:17 PM   Radiology CT HEAD CODE STROKE WO CONTRAST  Result Date: 09/23/2019 CLINICAL DATA:  Code stroke. Initial evaluation for acute aphasia. Six EXAM: CT HEAD WITHOUT CONTRAST TECHNIQUE: Contiguous axial images were obtained from the base of the skull through the vertex without intravenous contrast. COMPARISON:  Prior head CT from 08/22/2019. FINDINGS: Brain: Generalized age-related cerebral atrophy with probable mild chronic microvascular ischemic disease. No acute intracranial hemorrhage. No acute large vessel territory infarct. No mass lesion, midline shift or mass effect. No hydrocephalus or extra-axial fluid collection. Vascular: No hyperdense vessel. Scattered vascular calcifications noted within the carotid siphons. Skull: Scalp soft tissues and calvarium within normal limits. Sinuses/Orbits: Globes and orbital soft tissues demonstrate no acute finding. Possible small optic drusen body noted at the left optic disc. Paranasal sinuses are clear. No mastoid effusion. Other: None. ASPECTS [facility] Stroke Program Early CT Score) - Ganglionic level infarction (caudate, lentiform nuclei, internal capsule, insula, M1-M3 cortex): 7 - Supraganglionic infarction (M4-M6 cortex): 3 Total score (0-10 with 10 being normal): 10 IMPRESSION: 1. No acute intracranial infarct or other abnormality. 2. ASPECTS is 10. 3. Mild  age-related cerebral atrophy with chronic small vessel ischemic disease. These results were communicated to Dr. Rory Percy at [place] 7/26/2021by text page via the [facility] messaging system. Electronically Signed   By: Jeannine Boga M.D.   On: 09/23/2019 19:48    Procedures Procedures (including critical care time) CRITICAL CARE Performed by: Valarie Merino   Total critical care time: 30 minutes  Critical care time was exclusive of separately billable procedures and treating other patients.  Critical care was necessary to treat or prevent imminent or life-threatening deterioration.  Critical care was time spent personally by me on the following activities: development of treatment plan with patient and/or surrogate as well as nursing, discussions with consultants, evaluation of patient's response to treatment, examination of patient, obtaining history from patient or surrogate, ordering and performing treatments and interventions, ordering and review of laboratory studies, ordering and review of radiographic studies, pulse oximetry and re-evaluation of patient's condition.   Medications Ordered in ED Medications - No data to display  ED Course  I have reviewed the triage vital signs and the nursing notes.  Pertinent labs & imaging results that were available during my care of the patient were reviewed by me and considered in my medical decision making (see chart for details).    MDM Rules/Calculators/A&P                          MDM  Screen complete  MAECI KALBFLEISCH was evaluated in Emergency Department on 09/23/2019 for the symptoms described in the history of present illness. She was evaluated in the context of the global COVID-19 pandemic, which necessitated consideration that the patient might be at risk for infection with the SARS-CoV-2 virus that causes COVID-19. Institutional protocols and algorithms that pertain to the evaluation of patients at risk for COVID-19 are in a state  of rapid change based on information released by regulatory bodies including the CDC and federal and state organizations. These policies and algorithms were followed during the patient's care in the ED.  Patient arrives with AMS.   Neuro has deemed patient not a candidate for acute intervention (TPA).  Hospitalist service is aware of case and  will evaluate for admission.      Final ClinicalPatient was deemed to be not Impression(s) / ED Diagnoses Final diagnoses:  Altered mental status, unspecified altered mental status type    Rx / DC Orders ED Discharge Orders    None       Valarie Merino, MD 09/23/19 2134

## 2019-09-23 NOTE — H&P (Signed)
History and Physical    Kellie Moore AST:[id number] DOB: [birth year] DOA: 09/23/2019  PCP: Wenda Low, MD  Patient coming from: Home.  History obtained from patient's husband.  Chief Complaint: Unresponsive episode.  HPI: Kellie Moore is a 69 y.o. female with history of Parkinson's disease, dementia prior history of breast cancer was brought to the ER after patient's husband found that patient became less responsive with difficulty speaking.  As per the husband patient at around 5 PM today wanted to sleep and when patient husband came back to check on the patient again to help her feed her dinner around 5:30 PM patient was unresponsive not following commands and not talking much and was brought to the ER.  As per the husband patient usually walks without much help but will need help to get onto the bed and for other activities of daily living.  Patient also has been having increasing difficulty swallowing recently.  Recently was started on Aricept for dementia also.  ED Course: In the ER CT head was unremarkable patient was opening eyes on commands and moving extremities.  On-call neurologist was consulted at this time admitted for further management and including MRI brain has been ordered which is pending.  Covid test is negative labs are significant for hemoglobin of 11.9.  UA is pending.  EKG shows normal sinus rhythm.  Review of Systems: As per HPI, rest all negative.   Past Medical History:  Diagnosis Date  . Cancer [facility])    Breast cancer    Past Surgical History:  Procedure Laterality Date  . BREAST LUMPECTOMY Right    radiation     reports that she has never smoked. She has never used smokeless tobacco. She reports current alcohol use. She reports that she does not use drugs.  Allergies  Allergen Reactions  . Other Itching and Other (See Comments)    Dust and pollen = Itchy eyes and runny nose    Family History  Problem Relation Age of Onset  . Brain cancer Father      Prior to Admission medications   Medication Sig Start Date End Date Taking? Authorizing Provider  atropine 1 % ophthalmic solution For drooling,these drops can be diluted 34ml in 143ml of water and used as a mouth rinse up to three times a day. You can also try 2 drops under the tongue up to 3 times a day without diluting it if that works better for you Patient taking differently: Place 1-2 drops under the tongue See admin instructions. "For drooling, these drops can be diluted 1 ml in 100 ml's of water and used as a mouth rinse up to three times a day. You can also try 2 drops under the tongue up to 3 times a day without diluting it if that works better for you." 06/27/19  Yes Cameron Sprang, MD  bisacodyl (BISACODYL) 5 MG EC tablet Take 5 mg by mouth at bedtime as needed for mild constipation or moderate constipation.   Yes [provider]  calcium carbonate (TUMS EX) 750 MG chewable tablet Chew 1 tablet by mouth as needed for heartburn (or reflux).   Yes [provider]  carbidopa-levodopa (SINEMET IR) 25-100 MG tablet Take 1 tablet three times a day with meals Patient taking differently: Take 1 tablet by mouth See admin instructions. Take 1 tablet by mouth three times a day with meals- 9:30 AM, 1 PM, and 6 PM 08/28/19  Yes Cameron Sprang, MD  donepezil (  ARICEPT) 10 MG tablet Take 1/2 tablet daily for 2 weeks, then increase to 1 tablet daily Patient taking differently: Take 10 mg by mouth in the morning.  08/28/19  Yes Cameron Sprang, MD  mirtazapine (REMERON) 15 MG tablet TAKE 2 TABLETS BY MOUTH AT BEDTIME Patient taking differently: Take 30 mg by mouth at bedtime.  06/27/19  Yes Cameron Sprang, MD  Multiple Vitamins-Minerals (ONE-A-DAY WOMENS 50 PLUS) TABS Take 1 tablet by mouth daily with breakfast.   Yes [provider]  oxymetazoline (AFRIN) 0.05 % nasal spray Place 1 spray into both nostrils 2 (two) times daily as needed for congestion.   Yes [provider]  oxymetazoline (VICKS SINEX SEVERE DECONGEST) 0.05 % nasal spray Place 1 spray into both nostrils 2 (two) times daily as needed for congestion.   Yes [provider]    Physical Exam: Constitutional: Moderately built and nourished. Vitals:   09/23/19 2030 09/23/19 2045 09/23/19 2100 09/23/19 2115  BP: (!) 140/87 (!) 133/84 (!) 144/88 (!) 135/72  Pulse: 75 73 73 70  Resp: 14 14 12 16   SpO2: 99% 98% 100% 99%  Weight:      Height:       Eyes: Anicteric no pallor. ENMT: No discharge from the ears eyes nose or mouth. Neck: No mass felt.  No neck rigidity. Respiratory: No rhonchi or crepitations. Cardiovascular: S1-S2 heard. Abdomen: Soft nontender bowel sounds present. Musculoskeletal: No edema. Skin: No rash. Neurologic: Patient is lethargic but on calling her name she is opening eyes none requesting to move upper extremities and lower extremities she is able to do.  Pupils are equal reacting to light. Psychiatric: Mildly lethargic.  Oriented to her name.   Labs on Admission: I have personally reviewed following labs and imaging studies  CBC: Recent Labs  Lab 09/23/19 1956  WBC 6.4  NEUTROABS 4.8  HGB 11.9*  HCT 36.5  MCV 97.1  PLT 211   Basic Metabolic Panel: Recent Labs  Lab 09/23/19 1956  NA 137  K 4.8  CL 107  CO2 22  GLUCOSE 100*  BUN 16  CREATININE 0.74  CALCIUM 7.8*   GFR: Estimated Creatinine Clearance: 44.1 mL/min (by C-G formula based on SCr of 0.74 mg/dL). Liver Function Tests: Recent Labs  Lab 09/23/19 1956  AST 42*  ALT 22  ALKPHOS 52  BILITOT 0.7  PROT 5.4*  ALBUMIN 3.4*   No results for input(s): LIPASE, AMYLASE in the last 168 hours. No results for input(s): AMMONIA in the last 168 hours. Coagulation Profile: Recent Labs  Lab 09/23/19 1956  INR 1.1   Cardiac Enzymes: No results for input(s): CKTOTAL, CKMB, CKMBINDEX, TROPONINI in the last 168 hours. BNP (last 3 results) No results for input(s): PROBNP in  the last 8760 hours. HbA1C: No results for input(s): HGBA1C in the last 72 hours. CBG: Recent Labs  Lab 09/23/19 1948  GLUCAP 102*   Lipid Profile: No results for input(s): CHOL, HDL, LDLCALC, TRIG, CHOLHDL, LDLDIRECT in the last 72 hours. Thyroid Function Tests: No results for input(s): TSH, T4TOTAL, FREET4, T3FREE, THYROIDAB in the last 72 hours. Anemia Panel: No results for input(s): VITAMINB12, FOLATE, FERRITIN, TIBC, IRON, RETICCTPCT in the last 72 hours. Urine analysis:    Component Value Date/Time   COLORURINE YELLOW 08/22/2019 [place] 08/22/2019 1141   LABSPEC 1.015 08/22/2019 1141   PHURINE 6.5 08/22/2019 1141   GLUCOSEU NEGATIVE 08/22/2019 1141   [place] 08/22/2019 1141   BILIRUBINUR  NEGATIVE 08/22/2019 1141   KETONESUR NEGATIVE 08/22/2019 1141   UROBILINOGEN 0.2 08/22/2019 1141   NITRITE NEGATIVE 08/22/2019 1141   LEUKOCYTESUR NEGATIVE 08/22/2019 1141   Sepsis Labs: @LABRCNTIP (procalcitonin:4,lacticidven:4) ) Recent Results (from the past 240 hour(s))  SARS Coronavirus 2 by RT PCR (hospital order, performed in [facility] hospital lab) Nasopharyngeal Nasopharyngeal Swab     Status: None   Collection Time: 09/23/19  8:24 PM   Specimen: Nasopharyngeal Swab  Result Value Ref Range Status   SARS Coronavirus 2 NEGATIVE NEGATIVE Final    Comment: (NOTE) SARS-CoV-2 target nucleic acids are NOT DETECTED.  The SARS-CoV-2 RNA is generally detectable in upper and lower respiratory specimens during the acute phase of infection. The lowest concentration of SARS-CoV-2 viral copies this assay can detect is 250 copies / mL. A negative result does not preclude SARS-CoV-2 infection and should not be used as the sole basis for treatment or other patient management decisions.  A negative result may occur with improper specimen collection / handling, submission of specimen other than nasopharyngeal swab, presence of viral mutation(s) within the areas  targeted by this assay, and inadequate number of viral copies (<250 copies / mL). A negative result must be combined with clinical observations, patient history, and epidemiological information.  Fact Sheet for Patients:   StrictlyIdeas.no  Fact Sheet for Healthcare Providers: BankingDealers.co.za  This test is not yet approved or  cleared by the [place] FDA and has been authorized for detection and/or diagnosis of SARS-CoV-2 by FDA under an Emergency Use Authorization (EUA).  This EUA will remain in effect (meaning this test can be used) for the duration of the COVID-19 declaration under Section 564(b)(1) of the Act, 21 U.S.C. section 360bbb-3(b)(1), unless the authorization is terminated or revoked sooner.  Performed at [facility], [place] [address], [place], [place] [address]      Radiological Exams on Admission: CT HEAD CODE STROKE WO CONTRAST  Result Date: 09/23/2019 CLINICAL DATA:  Code stroke. Initial evaluation for acute aphasia. Six EXAM: CT HEAD WITHOUT CONTRAST TECHNIQUE: Contiguous axial images were obtained from the base of the skull through the vertex without intravenous contrast. COMPARISON:  Prior head CT from 08/22/2019. FINDINGS: Brain: Generalized age-related cerebral atrophy with probable mild chronic microvascular ischemic disease. No acute intracranial hemorrhage. No acute large vessel territory infarct. No mass lesion, midline shift or mass effect. No hydrocephalus or extra-axial fluid collection. Vascular: No hyperdense vessel. Scattered vascular calcifications noted within the carotid siphons. Skull: Scalp soft tissues and calvarium within normal limits. Sinuses/Orbits: Globes and orbital soft tissues demonstrate no acute finding. Possible small optic drusen body noted at the left optic disc. Paranasal sinuses are clear. No mastoid effusion. Other: None. ASPECTS [facility] Stroke Program Early CT Score) -  Ganglionic level infarction (caudate, lentiform nuclei, internal capsule, insula, M1-M3 cortex): 7 - Supraganglionic infarction (M4-M6 cortex): 3 Total score (0-10 with 10 being normal): 10 IMPRESSION: 1. No acute intracranial infarct or other abnormality. 2. ASPECTS is 10. 3. Mild age-related cerebral atrophy with chronic small vessel ischemic disease. These results were communicated to Dr. Rory Percy at [place] 7/26/2021by text page via the [facility] messaging system. Electronically Signed   By: Jeannine Boga M.D.   On: 09/23/2019 19:48    EKG: Independently reviewed.  Normal sinus rhythm.  Assessment/Plan Principal Problem:   Acute encephalopathy Active Problems:   Parkinson's disease ([place])   Dementia ([place])    1. Acute encephalopathy/less responsive -appreciate neurology consult.  MRI brain and UA  are pending.  EEG also has been ordered.  This time patient's symptoms are concerning for most likely metabolic causes.  Also could be due to her dementia. 2. History of Parkinson's disease on Sinemet presently n.p.o. we will get swallow evaluation.  If patient failed swallow will need probably NG tube and Sinemet. 3. History of dementia recently placed on Aricept. 4. Anemia -follow CBC check anemia panel.   DVT prophylaxis: Lovenox. Code Status: DNR confirmed with patient's husband. Family Communication: Patient's husband. Disposition Plan: May need rehab or SNF placement. Consults called: Neurology. Admission status: Observation.   Rise Patience MD [facility] Pager [phone].  If 7PM-7AM, please contact night-coverage [email or web address] Password [facility]  09/23/2019, 10:12 PM

## 2019-09-23 NOTE — ED Triage Notes (Addendum)
Pt BIB [facility] for eval as a Code Stroke. Per EMS pt has hx of parkinsons, but normally is conversive and A&Ox4. Pt's husband called her for dinner and noted she did not respond. Pt w/ eyes open, but no verbal response. Pt entirely aphasic for EMS. No other obvious neuro deficits per EMS, pt remains aphasic on arrival. LKN 1700. No apparent droop or unilateral drift on arrival. Activated as Code Stroke from the field and taken to CT 1 w/ neurology.

## 2019-09-24 ENCOUNTER — Observation Stay (HOSPITAL_COMMUNITY): Payer: PPO

## 2019-09-24 DIAGNOSIS — G20 Parkinson's disease: Secondary | ICD-10-CM | POA: Diagnosis not present

## 2019-09-24 DIAGNOSIS — R569 Unspecified convulsions: Secondary | ICD-10-CM

## 2019-09-24 DIAGNOSIS — R4182 Altered mental status, unspecified: Secondary | ICD-10-CM | POA: Diagnosis not present

## 2019-09-24 DIAGNOSIS — G9340 Encephalopathy, unspecified: Secondary | ICD-10-CM

## 2019-09-24 DIAGNOSIS — G319 Degenerative disease of nervous system, unspecified: Secondary | ICD-10-CM | POA: Diagnosis not present

## 2019-09-24 LAB — GLUCOSE, CAPILLARY
Glucose-Capillary: 104 mg/dL — ABNORMAL HIGH (ref 70–99)
Glucose-Capillary: 105 mg/dL — ABNORMAL HIGH (ref 70–99)
Glucose-Capillary: 81 mg/dL (ref 70–99)
Glucose-Capillary: 92 mg/dL (ref 70–99)

## 2019-09-24 LAB — URINALYSIS, ROUTINE W REFLEX MICROSCOPIC
Bilirubin Urine: NEGATIVE
Glucose, UA: NEGATIVE mg/dL
Hgb urine dipstick: NEGATIVE
Ketones, ur: NEGATIVE mg/dL
Leukocytes,Ua: NEGATIVE
Nitrite: NEGATIVE
Protein, ur: NEGATIVE mg/dL
Specific Gravity, Urine: 1.012 (ref 1.005–1.030)
pH: 8 (ref 5.0–8.0)

## 2019-09-24 LAB — RAPID URINE DRUG SCREEN, HOSP PERFORMED
Amphetamines: NOT DETECTED
Barbiturates: NOT DETECTED
Benzodiazepines: POSITIVE — AB
Cocaine: NOT DETECTED
Opiates: NOT DETECTED
Tetrahydrocannabinol: NOT DETECTED

## 2019-09-24 LAB — CBC
HCT: 36.2 % (ref 36.0–46.0)
Hemoglobin: 12 g/dL (ref 12.0–15.0)
MCH: 31.4 pg (ref 26.0–34.0)
MCHC: 33.1 g/dL (ref 30.0–36.0)
MCV: 94.8 fL (ref 80.0–100.0)
Platelets: 233 10*3/uL (ref 150–400)
RBC: 3.82 MIL/uL — ABNORMAL LOW (ref 3.87–5.11)
RDW: 12.9 % (ref 11.5–15.5)
WBC: 7.2 10*3/uL (ref 4.0–10.5)
nRBC: 0 % (ref 0.0–0.2)

## 2019-09-24 LAB — CREATININE, SERUM
Creatinine, Ser: 0.75 mg/dL (ref 0.44–1.00)
GFR calc Af Amer: >60 mL/min (ref >60)
GFR calc non Af Amer: >60 mL/min (ref >60)

## 2019-09-24 LAB — TROPONIN I (HIGH SENSITIVITY): Troponin I (High Sensitivity): 10 ng/L (ref <18)

## 2019-09-24 LAB — LACTIC ACID, PLASMA: Lactic Acid, Venous: 0.9 mmol/L (ref 0.5–1.9)

## 2019-09-24 MED ORDER — CARBIDOPA-LEVODOPA 25-100 MG PO TABS
1.0000 | ORAL_TABLET | ORAL | Status: DC
  Administered 2019-09-24 (×2): 1 via ORAL
  Filled 2019-09-24 (×2): qty 1

## 2019-09-24 MED ORDER — MIRTAZAPINE 15 MG PO TABS: 30.0000 mg | ORAL_TABLET | Freq: Every day | ORAL | Status: DC

## 2019-09-24 MED ORDER — DONEPEZIL HCL 10 MG PO TABS: 10.0000 mg | ORAL_TABLET | Freq: Every morning | ORAL | Status: DC

## 2019-09-24 MED ORDER — BISACODYL 5 MG PO TBEC: 5.0000 mg | DELAYED_RELEASE_TABLET | Freq: Every evening | ORAL | Status: DC | PRN

## 2019-09-24 NOTE — Care Management Obs Status (Signed)
[place] NOTIFICATION   Patient Details  Name: Kellie Moore MRN: [id number] Date of Birth: 1950-10-09   Medicare Observation Status Notification Given:  Yes    Geralynn Ochs, [place] 09/24/2019, 4:26 PM

## 2019-09-24 NOTE — Evaluation (Signed)
Speech Language Pathology Evaluation Patient Details Name: Kellie Moore MRN: [id number] DOB: 1950/09/27 Today's Date: 09/24/2019 Time: 1030-1055 SLP Time Calculation (min) (ACUTE ONLY): 25 min  Problem List:  Patient Active Problem List   Diagnosis Date Noted  . Acute encephalopathy 09/23/2019  . Parkinson's disease ([place]) 09/23/2019  . Dementia ([place]) 09/23/2019   Past Medical History:  Past Medical History:  Diagnosis Date  . Cancer [facility])    Breast cancer   Past Surgical History:  Past Surgical History:  Procedure Laterality Date  . BREAST LUMPECTOMY Right    radiation   HPI:  Pt is a 69 yo female with PMH + for Parkinson's disease diagnosed approx 2-3 years ago with symptoms occuring 2-3 years before diagnosis.  Pt also with h/o anxiety, frequent falls in the last few months, breast cancer s/p lumpectomy/XRT, sialorrhea s/p atropine drop treatment, dysphagia.  Pt has lost weight to 132 down to 94 pounds over the last 3 1/2 to 4 years.  She has poor appetite but does not report food tasting bad.  Per spouse, pt consumes 2 1/2 meals daily.  She can drink down Boost but takes few small sips of water with "gulping" sound.  Pt and spouse deny pt having pneumonias, requiring heimlich manuever nor recent weight loss.  She mostly eats softer foods. In hospital with acute change in mentation and dysarthria--now resolved. Baseline dementia.   Assessment / Plan / Recommendation Clinical Impression  Pt demonstrates a moderate-severe cognitive impairment and mild motor speech impairment consistent with her baseline diagnoses of dementia and Parkinson's disease. Cognition is characterized by decreased orientation (to day of week), calculations, attention, and memory. She also has reduced problem solving abilities related to her decreased attention. She was noted to be highly distractible during evaluation, requiring cues to attend to task. She required frequent repetition of questions/directions d/t  reduced attention. She was assessed with the [place] Status Examination with total score of 16/30, consistent with dementia. Pt reports being frustrated with her memory difficulty. She has a very attentive husband who assists with complex cognitive tasks. Speech is characterized by very fast rate and rushes of speech, with reduced inspiratory volume. Pt had max phonation time of 6 seconds on three trials, with 15+ being WNL for age and gender. Vocal intensity was also mildly reduced, but functional. Given progressive nature of Parkinson's disease and increasing deficits, recommend outpatient therapy to target speech production, vocal intensity, and swallow function. Pt would likely benefit from EMST/RMST. No further ST indicated on acute level.  Continue recommendations for husband to assist/supervise all IADL tasks.    SLP Assessment  SLP Recommendation/Assessment: All further Speech Lanaguage Pathology  needs can be addressed in the next venue of care SLP Visit Diagnosis: Dysarthria and anarthria (R47.1)    Follow Up Recommendations  Outpatient SLP       SLP Evaluation Cognition  Overall Cognitive Status: History of cognitive impairments - at baseline Arousal/Alertness: Awake/alert Orientation Level: Oriented X4 Attention: Sustained;Selective Sustained Attention: Impaired Sustained Attention Impairment: Verbal basic;Functional basic Selective Attention: Impaired Selective Attention Impairment: Functional basic;Functional complex;Verbal complex;Verbal basic Memory: Impaired Memory Impairment: Storage deficit;Retrieval deficit;Decreased short term memory Decreased Short Term Memory: Verbal basic;Verbal complex;Functional basic;Functional complex Awareness: Appears intact Problem Solving: Impaired Problem Solving Impairment: Functional complex;Verbal complex Executive Function: Organizing;Decision Making;Self Monitoring Organizing: Impaired Decision Making:  Impaired Self Monitoring: Impaired Behaviors: Restless;Impulsive Safety/Judgment: Appears intact       Comprehension  Auditory Comprehension Overall Auditory Comprehension: Appears within functional  limits for tasks assessed Visual Recognition/Discrimination Discrimination: Within Function Limits Reading Comprehension Reading Status: Within funtional limits    Expression Expression Primary Mode of Expression: Verbal Verbal Expression Overall Verbal Expression: Appears within functional limits for tasks assessed Naming: No impairment Pragmatics: No impairment Written Expression Dominant Hand: Right Written Expression: Within Functional Limits   Oral / Motor  Oral Motor/Sensory Function Overall Oral Motor/Sensory Function: Within functional limits Motor Speech Overall Motor Speech: Appears within functional limits for tasks assessed Respiration: Within functional limits Phonation: Hoarse;Low vocal intensity Resonance: Within functional limits Articulation: Impaired Level of Impairment: Conversation Intelligibility: Intelligibility reduced Word: 75-100% accurate Phrase: 75-100% accurate Sentence: 75-100% accurate Conversation: 75-100% accurate Motor Planning: Witnin functional limits Effective Techniques: Slow rate;Pacing                     Lazar Tierce P. Dontrel Smethers, M.S., CCC-SLP Speech-Language Pathologist Acute Rehabilitation Services Pager: [place] 09/24/2019, 11:27 AM

## 2019-09-24 NOTE — Progress Notes (Signed)
EEG complete - results pending 

## 2019-09-24 NOTE — Progress Notes (Signed)
PROGRESS NOTE    Kellie Moore  QJF:[id number] DOB: [birth year] DOA: 09/23/2019 PCP: Wenda Low, MD   Brief Narrative:  Patient is a 69 year old female with history of Parkinson disease, dementia, history of breast cancer who was brought to the emergency department after she was found to be less responsive, dysarthric.  She was not following commands and did not talk so she was brought to the emergency department.  As per the family, she usually walks without any help but will need help to go into the back and other activities of daily living. She is confused at baseline. Brain imagings done in the emergency department did not show any acute intracranial abnormalities.  Patient admitted for further work-up.  Neurology also following  Assessment & Plan:   Principal Problem:   Acute encephalopathy Active Problems:   Parkinson's disease ([place])   Dementia ([place])   Acute encephalopathy: MRI of the brain, CT head did not show any acute intracranial normalities.  EEG did not show any seizures or epileptiform discharges.  UA not impressive for urinary tract infection.  Monitor mental status. PT/OT/speech consultation done.  Neurology has also been consulted. Looks like her mental status is back to baseline.  History of Parkinson's disease/dementia: On Sinemet.  Also on Aricept.  Continue supportive care.  Delirium precautions.  She is confused at baseline and needs support for daily activities.  History of depression: Takes mirtazapine which will be continued.          DVT prophylaxis:Lovenox Code Status: DNR Family Communication:  Status is: Observation  The patient remains OBS appropriate and will d/c before 2 midnights.  Dispo: The patient is from: Home              Anticipated d/c is to: Home              Anticipated d/c date is: tomorrow              Patient currently is not medically stable to d/c. Needs to be monitored at least one more night, neurology clearance required  before discharge.    Consultants: neurology  Procedures:None  Antimicrobials:  Anti-infectives (From admission, onward)   None      Subjective: Patient seen and examined at the bedside this morning.  Husband was being at the bedside.  She is being hooked up with EEG.  She looked calm and cooperative, but confused.  Denies any complaints  Objective: Vitals:   09/24/19 0216 09/24/19 0345 09/24/19 0357 09/24/19 0603  BP: 122/72 (!) 163/91 120/74 125/65  Pulse: 58 93 64 60  Resp: 18  20 19   Temp: 97.7 F (36.5 C) 99 F (37.2 C) 98.5 F (36.9 C) 98.6 F (37 C)  TempSrc: Oral Oral Axillary Oral  SpO2: 99% 100% 100% 97%  Weight:      Height:        Intake/Output Summary (Last 24 hours) at 09/24/2019 0800 Last data filed at 09/24/2019 0407 Gross per 24 hour  Intake 262.2 ml  Output --  Net 262.2 ml   Filed Weights   09/23/19 1950 09/23/19 2351  Weight: (!) 43.3 kg 44.3 kg    Examination:  General exam: Deconditioned, debilitated female HEENT:PERRL,Oral mucosa moist, Ear/Nose normal on gross exam Respiratory system: Bilateral equal air entry, normal vesicular breath sounds, no wheezes or crackles  Cardiovascular system: S1 & S2 heard, RRR. No JVD, murmurs, rubs, gallops or clicks. No pedal edema. Gastrointestinal system: Abdomen is nondistended, soft and nontender. No organomegaly  or masses felt. Normal bowel sounds heard. Central nervous system: Alert and awake but not oriented Extremities: No edema, no clubbing ,no cyanosis, Skin: No rashes, lesions or ulcers,no icterus ,no pallor   Data Reviewed: I have personally reviewed following labs and imaging studies  CBC: Recent Labs  Lab 09/23/19 1956 09/24/19 0243  WBC 6.4 7.2  NEUTROABS 4.8  --   HGB 11.9* 12.0  HCT 36.5 36.2  MCV 97.1 94.8  PLT 185 062   Basic Metabolic Panel: Recent Labs  Lab 09/23/19 1956 09/24/19 0243  NA 137  --   K 4.8  --   CL 107  --   CO2 22  --   GLUCOSE 100*  --   BUN 16   --   CREATININE 0.74 0.75  CALCIUM 7.8*  --    GFR: Estimated Creatinine Clearance: 45.1 mL/min (by C-G formula based on SCr of 0.75 mg/dL). Liver Function Tests: Recent Labs  Lab 09/23/19 1956  AST 42*  ALT 22  ALKPHOS 52  BILITOT 0.7  PROT 5.4*  ALBUMIN 3.4*   No results for input(s): LIPASE, AMYLASE in the last 168 hours. No results for input(s): AMMONIA in the last 168 hours. Coagulation Profile: Recent Labs  Lab 09/23/19 1956  INR 1.1   Cardiac Enzymes: No results for input(s): CKTOTAL, CKMB, CKMBINDEX, TROPONINI in the last 168 hours. BNP (last 3 results) No results for input(s): PROBNP in the last 8760 hours. HbA1C: No results for input(s): HGBA1C in the last 72 hours. CBG: Recent Labs  Lab 09/23/19 1948 09/23/19 2241 09/24/19 0016 09/24/19 0612  GLUCAP 102* 93 104* 105*   Lipid Profile: No results for input(s): CHOL, HDL, LDLCALC, TRIG, CHOLHDL, LDLDIRECT in the last 72 hours. Thyroid Function Tests: No results for input(s): TSH, T4TOTAL, FREET4, T3FREE, THYROIDAB in the last 72 hours. Anemia Panel: No results for input(s): VITAMINB12, FOLATE, FERRITIN, TIBC, IRON, RETICCTPCT in the last 72 hours. Sepsis Labs: Recent Labs  Lab 09/23/19 2010 09/24/19 0243  LATICACIDVEN 1.2 0.9    Recent Results (from the past 240 hour(s))  SARS Coronavirus 2 by RT PCR (hospital order, performed in [facility] hospital lab) Nasopharyngeal Nasopharyngeal Swab     Status: None   Collection Time: 09/23/19  8:24 PM   Specimen: Nasopharyngeal Swab  Result Value Ref Range Status   SARS Coronavirus 2 NEGATIVE NEGATIVE Final    Comment: (NOTE) SARS-CoV-2 target nucleic acids are NOT DETECTED.  The SARS-CoV-2 RNA is generally detectable in upper and lower respiratory specimens during the acute phase of infection. The lowest concentration of SARS-CoV-2 viral copies this assay can detect is 250 copies / mL. A negative result does not preclude SARS-CoV-2 infection and  should not be used as the sole basis for treatment or other patient management decisions.  A negative result may occur with improper specimen collection / handling, submission of specimen other than nasopharyngeal swab, presence of viral mutation(s) within the areas targeted by this assay, and inadequate number of viral copies (<250 copies / mL). A negative result must be combined with clinical observations, patient history, and epidemiological information.  Fact Sheet for Patients:   StrictlyIdeas.no  Fact Sheet for Healthcare Providers: BankingDealers.co.za  This test is not yet approved or  cleared by the [place] FDA and has been authorized for detection and/or diagnosis of SARS-CoV-2 by FDA under an Emergency Use Authorization (EUA).  This EUA will remain in effect (meaning this test can be used) for the duration of the  COVID-19 declaration under Section 564(b)(1) of the Act, 21 U.S.C. section 360bbb-3(b)(1), unless the authorization is terminated or revoked sooner.  Performed at [facility], [place] [address], [place], [place] [address]          Radiology Studies: MR BRAIN WO CONTRAST  Result Date: 09/24/2019 CLINICAL DATA:  Initial evaluation for acute mental status change. EXAM: MRI HEAD WITHOUT CONTRAST TECHNIQUE: Multiplanar, multiecho pulse sequences of the brain and surrounding structures were obtained without intravenous contrast. COMPARISON:  Prior CT from 09/23/2019. FINDINGS: Brain: Examination degraded by motion artifact. Mildly advanced age-related cerebral atrophy. No significant cerebral white matter disease for age. No abnormal foci of restricted diffusion to suggest acute or subacute ischemia. Gray-white matter differentiation maintained. No encephalomalacia to suggest chronic cortical infarction. No foci of susceptibility artifact to suggest acute or chronic intracranial hemorrhage. No mass lesion, midline  shift or mass effect. No hydrocephalus or extra-axial fluid collection. Pituitary gland suprasellar region normal. Midline structures intact. Vascular: Major intracranial vascular flow voids are maintained. Skull and upper cervical spine: Craniocervical junction within normal limits. Bone marrow signal intensity normal. No scalp soft tissue abnormality. Sinuses/Orbits: Globes and orbital soft tissues within normal limits. Paranasal sinuses are clear. No mastoid effusion. Other: None. IMPRESSION: 1. No acute intracranial abnormality. 2. Mildly advanced age-related cerebral atrophy. Electronically Signed   By: Jeannine Boga M.D.   On: 09/24/2019 02:26   CT HEAD CODE STROKE WO CONTRAST  Result Date: 09/23/2019 CLINICAL DATA:  Code stroke. Initial evaluation for acute aphasia. Six EXAM: CT HEAD WITHOUT CONTRAST TECHNIQUE: Contiguous axial images were obtained from the base of the skull through the vertex without intravenous contrast. COMPARISON:  Prior head CT from 08/22/2019. FINDINGS: Brain: Generalized age-related cerebral atrophy with probable mild chronic microvascular ischemic disease. No acute intracranial hemorrhage. No acute large vessel territory infarct. No mass lesion, midline shift or mass effect. No hydrocephalus or extra-axial fluid collection. Vascular: No hyperdense vessel. Scattered vascular calcifications noted within the carotid siphons. Skull: Scalp soft tissues and calvarium within normal limits. Sinuses/Orbits: Globes and orbital soft tissues demonstrate no acute finding. Possible small optic drusen body noted at the left optic disc. Paranasal sinuses are clear. No mastoid effusion. Other: None. ASPECTS [facility] Stroke Program Early CT Score) - Ganglionic level infarction (caudate, lentiform nuclei, internal capsule, insula, M1-M3 cortex): 7 - Supraganglionic infarction (M4-M6 cortex): 3 Total score (0-10 with 10 being normal): 10 IMPRESSION: 1. No acute intracranial infarct or other  abnormality. 2. ASPECTS is 10. 3. Mild age-related cerebral atrophy with chronic small vessel ischemic disease. These results were communicated to Dr. Rory Percy at [facility] 7/26/2021by text page via the [facility] messaging system. Electronically Signed   By: Jeannine Boga M.D.   On: 09/23/2019 19:48        Scheduled Meds: .  stroke: mapping our early stages of recovery book   Does not apply Once  . aspirin  300 mg Rectal Daily   Or  . aspirin  325 mg Oral Daily  . enoxaparin (LOVENOX) injection  40 mg Subcutaneous Q24H   Continuous Infusions: . sodium chloride 50 mL/hr at 09/24/19 0407     LOS: 0 days    Time spent: 25 mins.More than 50% of that time was spent in counseling and/or coordination of care.      Shelly Coss, MD [facility] P7/27/2021, 8:00 AM

## 2019-09-24 NOTE — TOC Initial Note (Signed)
Transition of Care [facility]) - Initial/Assessment Note    Patient Details  Name: Kellie Moore MRN: [id number] Date of Birth: [birth year]  Transition of [facility] CM/SW Contact:    Geralynn Ochs, LCSW Phone Number: 09/24/2019, 4:27 PM  Clinical Narrative:      CSW met with patient to discuss therapy recommendations. Patient asked CSW to contact husband. CSW spoke with husband, and husband would prefer outpatient. CSW sent referral to neuro rehab. Patient's husband asked if they could change in the future, and CSW explained to husband how to approach PCP after discharge for home health orders, if need be.             Expected Discharge Plan: OP Rehab Barriers to Discharge: Continued Medical Work up   Patient Goals and CMS Choice Patient states their goals for this hospitalization and ongoing recovery are:: patient unable to participate in goal setting due to dementia CMS Medicare.gov Compare Post Acute Care list provided to:: Patient Represenative (must comment) Choice offered to / list presented to : Spouse  Expected Discharge Plan and Services Expected Discharge Plan: OP Rehab       Living arrangements for the past 2 months: Single Family Home                                      Prior Living Arrangements/Services Living arrangements for the past 2 months: Single Family Home Lives with:: Spouse Patient language and need for interpreter reviewed:: No Do you feel safe going back to the place where you live?: Yes      Need for Family Participation in Patient Care: Yes (Comment) Care giver support system in place?: Yes (comment)   Criminal Activity/Legal Involvement Pertinent to Current Situation/Hospitalization: No - Comment as needed  Activities of Daily Living      Permission Sought/Granted Permission sought to share information with : Family Supports Permission granted to share information with : Yes, Verbal Permission Granted  Share Information with NAME:  Gershon Mussel     Permission granted to share info w Relationship: Husband     Emotional Assessment Appearance:: Appears stated age Attitude/Demeanor/Rapport: Engaged Affect (typically observed): Appropriate        Admission diagnosis:  Acute encephalopathy [G93.40] Altered mental status, unspecified altered mental status type [R41.82] Patient Active Problem List   Diagnosis Date Noted  . Acute encephalopathy 09/23/2019  . Parkinson's disease ([place]) 09/23/2019  . Dementia ([place]) 09/23/2019   PCP:  Wenda Low, MD Pharmacy:   [place], [place] - [place] [address] Phone: [phone] Fax: [phone]     Social Determinants of Health (SDOH) Interventions    Readmission Risk Interventions No flowsheet data found.

## 2019-09-24 NOTE — Progress Notes (Addendum)
Subjective: Patient has no complaints.  Objective: Current vital signs: BP 125/65 (BP Location: Right Arm)   Pulse 60   Temp 98.6 F (37 C) (Oral)   Resp 19   Ht 5\' 6"  (1.676 m)   Wt 44.3 kg   SpO2 97%   BMI 15.75 kg/m  Vital signs in last 24 hours: Temp:  [97.4 F (36.3 C)-99 F (37.2 C)] 98.6 F (37 C) (07/27 0603) Pulse Rate:  [58-93] 60 (07/27 0603) Resp:  [12-22] 19 (07/27 0603) BP: (120-166)/(65-91) 125/65 (07/27 0603) SpO2:  [92 %-100 %] 97 % (07/27 0603) Weight:  [43.3 kg-44.3 kg] 44.3 kg (07/26 2351)  Intake/Output from previous day: 07/26 0701 - 07/27 0700 In: 262.2 [I.V.:262.2] Out: -  Intake/Output this shift: No intake/output data recorded. Nutritional status:  Diet Order            Diet Heart Room service appropriate? Yes; Fluid consistency: Thin  Diet effective now                 Neurologic Exam: Mental Status: Patient is alert, oriented to hospital, initially stated it was 2020 but then corrected herself to 2021, initially said June but then corrected herself to July.  Was able to tell me how many quarters are in $2.25. Cranial Nerves: II: Visual Fields are full.  III,IV, VI: EOMI without ptosis or diploplia. Pupils equal, round and reactive to light V: Facial sensation is symmetric to temperature VII: Facial movement is symmetric.  VIII: hearing is intact to voice Motor: Moving all extremities with 5/5 strength with postural tremor when hands are held out straight Plantars: Toes are downgoing bilaterally.  Cerebellar: FNF intact  Lab Results: Results for orders placed or performed during the hospital encounter of 09/23/19 (from the past 48 hour(s))  Urinalysis, Routine w reflex microscopic     Status: None   Collection Time: 09/23/19  1:35 AM  Result Value Ref Range   Color, Urine YELLOW YELLOW   APPearance CLEAR CLEAR   Specific Gravity, Urine 1.012 1.005 - 1.030   pH 8.0 5.0 - 8.0   Glucose, UA NEGATIVE NEGATIVE mg/dL   Hgb urine  dipstick NEGATIVE NEGATIVE   Bilirubin Urine NEGATIVE NEGATIVE   Ketones, ur NEGATIVE NEGATIVE mg/dL   Protein, ur NEGATIVE NEGATIVE mg/dL   Nitrite NEGATIVE NEGATIVE   Leukocytes,Ua NEGATIVE NEGATIVE    Comment: Performed at [facility] [address], [place], [place] [address]  CBG monitoring, ED     Status: Abnormal   Collection Time: 09/23/19  7:48 PM  Result Value Ref Range   Glucose-Capillary 102 (H) 70 - 99 mg/dL    Comment: Glucose reference range applies only to samples taken after fasting for at least 8 hours.  Ethanol     Status: None   Collection Time: 09/23/19  7:56 PM  Result Value Ref Range   Alcohol, Ethyl (B) <10 <10 mg/dL    Comment: (NOTE) Lowest detectable limit for serum alcohol is 10 mg/dL.  For medical purposes only. Performed at [facility], [place] [address], [place], [place] [address]   Protime-INR     Status: None   Collection Time: 09/23/19  7:56 PM  Result Value Ref Range   Prothrombin Time 13.4 11.4 - 15.2 seconds   INR 1.1 0.8 - 1.2    Comment: (NOTE) INR goal varies based on device and disease states. Performed at [facility], [place] [address], [place], [place] [address]  APTT     Status: None   Collection Time: 09/23/19  7:56 PM  Result Value Ref Range   aPTT 26 24 - 36 seconds    Comment: Performed at [facility], [place] [address], [place], [place] [address]  CBC     Status: Abnormal   Collection Time: 09/23/19  7:56 PM  Result Value Ref Range   WBC 6.4 4.0 - 10.5 K/uL   RBC 3.76 (L) 3.87 - 5.11 MIL/uL   Hemoglobin 11.9 (L) 12.0 - 15.0 g/dL   HCT 36.5 36 - 46 %   MCV 97.1 80.0 - 100.0 fL   MCH 31.6 26.0 - 34.0 pg   MCHC 32.6 30.0 - 36.0 g/dL   RDW 13.2 11.5 - 15.5 %   Platelets 185 150 - 400 K/uL   nRBC 0.0 0.0 - 0.2 %    Comment: Performed at [facility], [place] [address] [place], [place] [address]  Differential     Status: None   Collection Time: 09/23/19  7:56 PM  Result Value Ref Range    Neutrophils Relative % 75 %   Neutro Abs 4.8 1.7 - 7.7 K/uL   Lymphocytes Relative 19 %   Lymphs Abs 1.2 0.7 - 4.0 K/uL   Monocytes Relative 5 %   Monocytes Absolute 0.4 0 - 1 K/uL   Eosinophils Relative 0 %   Eosinophils Absolute 0.0 0 - 0 K/uL   Basophils Relative 0 %   Basophils Absolute 0.0 0 - 0 K/uL   Immature Granulocytes 1 %   Abs Immature Granulocytes 0.03 0.00 - 0.07 K/uL    Comment: Performed at [facility] [address], [place], [place] [address]  Comprehensive metabolic panel     Status: Abnormal   Collection Time: 09/23/19  7:56 PM  Result Value Ref Range   Sodium 137 135 - 145 mmol/L   Potassium 4.8 3.5 - 5.1 mmol/L   Chloride 107 98 - 111 mmol/L   CO2 22 22 - 32 mmol/L   Glucose, Bld 100 (H) 70 - 99 mg/dL    Comment: Glucose reference range applies only to samples taken after fasting for at least 8 hours.   BUN 16 8 - 23 mg/dL   Creatinine, Ser 0.74 0.44 - 1.00 mg/dL   Calcium 7.8 (L) 8.9 - 10.3 mg/dL   Total Protein 5.4 (L) 6.5 - 8.1 g/dL   Albumin 3.4 (L) 3.5 - 5.0 g/dL   AST 42 (H) 15 - 41 U/L   ALT 22 0 - 44 U/L   Alkaline Phosphatase 52 38 - 126 U/L   Total Bilirubin 0.7 0.3 - 1.2 mg/dL   GFR calc non Af Amer >60 >60 mL/min   GFR calc Af Amer >60 >60 mL/min   Anion gap 8 5 - 15    Comment: Performed at [facility] [address] [place], [place] [address]  Lactic acid, plasma     Status: None   Collection Time: 09/23/19  8:10 PM  Result Value Ref Range   Lactic Acid, Venous 1.2 0.5 - 1.9 mmol/L    Comment: Performed at [facility] [address] [place], [place] [address]  Troponin I (High Sensitivity)     Status: None   Collection Time: 09/23/19  8:20 PM  Result Value Ref Range   Troponin I (High Sensitivity) 4 <18 ng/L    Comment: (NOTE) Elevated high sensitivity troponin I (hsTnI) values and  significant  changes across serial measurements may suggest ACS but many other  chronic and acute conditions are known to elevate  hsTnI results.  Refer to the "Links" section for chest pain algorithms and additional  guidance. Performed at [facility], [place] [address], [place], [place] [address]   SARS Coronavirus 2 by RT PCR (hospital order, performed in [facility] hospital lab) Nasopharyngeal Nasopharyngeal Swab     Status: None   Collection Time: 09/23/19  8:24 PM   Specimen: Nasopharyngeal Swab  Result Value Ref Range   SARS Coronavirus 2 NEGATIVE NEGATIVE    Comment: (NOTE) SARS-CoV-2 target nucleic acids are NOT DETECTED.  The SARS-CoV-2 RNA is generally detectable in upper and lower respiratory specimens during the acute phase of infection. The lowest concentration of SARS-CoV-2 viral copies this assay can detect is 250 copies / mL. A negative result does not preclude SARS-CoV-2 infection and should not be used as the sole basis for treatment or other patient management decisions.  A negative result may occur with improper specimen collection / handling, submission of specimen other than nasopharyngeal swab, presence of viral mutation(s) within the areas targeted by this assay, and inadequate number of viral copies (<250 copies / mL). A negative result must be combined with clinical observations, patient history, and epidemiological information.  Fact Sheet for Patients:   StrictlyIdeas.no  Fact Sheet for Healthcare Providers: BankingDealers.co.za  This test is not yet approved or  cleared by the [place] FDA and has been authorized for detection and/or diagnosis of SARS-CoV-2 by FDA under an Emergency Use Authorization (EUA).  This EUA will remain in effect (meaning this test can be used) for the duration of the COVID-19 declaration under Section 564(b)(1) of the Act, 21 U.S.C. section 360bbb-3(b)(1), unless the authorization is terminated or revoked sooner.  Performed at [facility], [place] [address], [place], [place] [address]    CBG monitoring, ED     Status: None   Collection Time: 09/23/19 10:41 PM  Result Value Ref Range   Glucose-Capillary 93 70 - 99 mg/dL    Comment: Glucose reference range applies only to samples taken after fasting for at least 8 hours.  Glucose, capillary     Status: Abnormal   Collection Time: 09/24/19 12:16 AM  Result Value Ref Range   Glucose-Capillary 104 (H) 70 - 99 mg/dL    Comment: Glucose reference range applies only to samples taken after fasting for at least 8 hours.   Comment 1 Notify RN    Comment 2 Document in Chart   Urine rapid drug screen (hosp performed)     Status: Abnormal   Collection Time: 09/24/19  1:36 AM  Result Value Ref Range   Opiates NONE DETECTED NONE DETECTED   Cocaine NONE DETECTED NONE DETECTED   Benzodiazepines POSITIVE (A) NONE DETECTED   Amphetamines NONE DETECTED NONE DETECTED   Tetrahydrocannabinol NONE DETECTED NONE DETECTED   Barbiturates NONE DETECTED NONE DETECTED    Comment: (NOTE) DRUG SCREEN FOR MEDICAL PURPOSES ONLY.  IF CONFIRMATION IS NEEDED FOR ANY PURPOSE, NOTIFY LAB WITHIN 5 DAYS.  LOWEST DETECTABLE LIMITS FOR URINE DRUG SCREEN Drug Class                     Cutoff (ng/mL) Amphetamine and metabolites    1000 Barbiturate and metabolites    200 Benzodiazepine                 440 Tricyclics and metabolites  300 Opiates and metabolites        300 Cocaine and metabolites        300 THC                            50 Performed at [facility], [place] [address], [place], [place] [address]   Lactic acid, plasma     Status: None   Collection Time: 09/24/19  2:43 AM  Result Value Ref Range   Lactic Acid, Venous 0.9 0.5 - 1.9 mmol/L    Comment: Performed at [facility] [address] [place], [place] [address]  Troponin I (High Sensitivity)     Status: None   Collection Time: 09/24/19  2:43 AM  Result Value Ref Range   Troponin I (High Sensitivity) 10 <18 ng/L    Comment: (NOTE) Elevated high sensitivity  troponin I (hsTnI) values and significant  changes across serial measurements may suggest ACS but many other  chronic and acute conditions are known to elevate hsTnI results.  Refer to the "Links" section for chest pain algorithms and additional  guidance. Performed at [facility], [place] [address] [place], [place] [address]   CBC     Status: Abnormal   Collection Time: 09/24/19  2:43 AM  Result Value Ref Range   WBC 7.2 4.0 - 10.5 K/uL   RBC 3.82 (L) 3.87 - 5.11 MIL/uL   Hemoglobin 12.0 12.0 - 15.0 g/dL   HCT 36.2 36 - 46 %   MCV 94.8 80.0 - 100.0 fL   MCH 31.4 26.0 - 34.0 pg   MCHC 33.1 30.0 - 36.0 g/dL   RDW 12.9 11.5 - 15.5 %   Platelets 233 150 - 400 K/uL   nRBC 0.0 0.0 - 0.2 %    Comment: Performed at [facility], [place] [address] [place], [place] [address]  Creatinine, serum     Status: None   Collection Time: 09/24/19  2:43 AM  Result Value Ref Range   Creatinine, Ser 0.75 0.44 - 1.00 mg/dL   GFR calc non Af Amer >60 >60 mL/min   GFR calc Af Amer >60 >60 mL/min    Comment: Performed at [facility] [address], [place], [place] [address]  Glucose, capillary     Status: Abnormal   Collection Time: 09/24/19  6:12 AM  Result Value Ref Range   Glucose-Capillary 105 (H) 70 - 99 mg/dL    Comment: Glucose reference range applies only to samples taken after fasting for at least 8 hours.   Comment 1 Notify RN    Comment 2 Document in Chart     Recent Results (from the past 240 hour(s))  SARS Coronavirus 2 by RT PCR (hospital order, performed in [facility] hospital lab) Nasopharyngeal Nasopharyngeal Swab     Status: None   Collection Time: 09/23/19  8:24 PM   Specimen: Nasopharyngeal Swab  Result Value Ref Range Status   SARS Coronavirus 2 NEGATIVE NEGATIVE Final    Comment: (NOTE) SARS-CoV-2 target nucleic acids are NOT DETECTED.  The SARS-CoV-2 RNA is generally detectable in upper and lower respiratory specimens during the acute phase of  infection. The lowest concentration of SARS-CoV-2 viral copies this assay can detect is 250 copies / mL. A negative result does not preclude SARS-CoV-2 infection and should not be used as the sole basis for treatment or other patient management decisions.  A  negative result may occur with improper specimen collection / handling, submission of specimen other than nasopharyngeal swab, presence of viral mutation(s) within the areas targeted by this assay, and inadequate number of viral copies (<250 copies / mL). A negative result must be combined with clinical observations, patient history, and epidemiological information.  Fact Sheet for Patients:   StrictlyIdeas.no  Fact Sheet for Healthcare Providers: BankingDealers.co.za  This test is not yet approved or  cleared by the [place] FDA and has been authorized for detection and/or diagnosis of SARS-CoV-2 by FDA under an Emergency Use Authorization (EUA).  This EUA will remain in effect (meaning this test can be used) for the duration of the COVID-19 declaration under Section 564(b)(1) of the Act, 21 U.S.C. section 360bbb-3(b)(1), unless the authorization is terminated or revoked sooner.  Performed at [facility], [place] [address] [place], [place] [address]     Lipid Panel No results for input(s): CHOL, TRIG, HDL, CHOLHDL, VLDL, LDLCALC in the last 72 hours.  Studies/Results: MR BRAIN WO CONTRAST Result Date: 09/24/2019 CLINICAL DATA:  Initial evaluation for acute mental status change. EXAM: MRI HEAD WITHOUT CONTRAST TECHNIQUE: Multiplanar, multiecho pulse sequences of the brain and surrounding structures were obtained without intravenous contrast. COMPARISON:  Prior CT from 09/23/2019. FINDINGS: Brain: Examination degraded by motion artifact. Mildly advanced age-related cerebral atrophy. No significant cerebral white matter disease for age. No abnormal foci of restricted  diffusion to suggest acute or subacute ischemia. Gray-white matter differentiation maintained. No encephalomalacia to suggest chronic cortical infarction. No foci of susceptibility artifact to suggest acute or chronic intracranial hemorrhage. No mass lesion, midline shift or mass effect. No hydrocephalus or extra-axial fluid collection. Pituitary gland suprasellar region normal. Midline structures intact. Vascular: Major intracranial vascular flow voids are maintained. Skull and upper cervical spine: Craniocervical junction within normal limits. Bone marrow signal intensity normal. No scalp soft tissue abnormality. Sinuses/Orbits: Globes and orbital soft tissues within normal limits. Paranasal sinuses are clear. No mastoid effusion. Other: None. IMPRESSION: 1. No acute intracranial abnormality. 2. Mildly advanced age-related cerebral atrophy. Electronically Signed   By: Jeannine Boga M.D.   On: 09/24/2019 02:26   CT HEAD CODE STROKE WO CONTRAST Result Date: 09/23/2019 CLINICAL DATA:  Code stroke. Initial evaluation for acute aphasia. Six EXAM: CT HEAD WITHOUT CONTRAST TECHNIQUE: Contiguous axial images were obtained from the base of the skull through the vertex without intravenous contrast. COMPARISON:  Prior head CT from 08/22/2019. FINDINGS: Brain: Generalized age-related cerebral atrophy with probable mild chronic microvascular ischemic disease. No acute intracranial hemorrhage. No acute large vessel territory infarct. No mass lesion, midline shift or mass effect. No hydrocephalus or extra-axial fluid collection. Vascular: No hyperdense vessel. Scattered vascular calcifications noted within the carotid siphons. Skull: Scalp soft tissues and calvarium within normal limits. Sinuses/Orbits: Globes and orbital soft tissues demonstrate no acute finding. Possible small optic drusen body noted at the left optic disc. Paranasal sinuses are clear. No mastoid effusion. Other: None. ASPECTS [facility] Stroke Program  Early CT Score) - Ganglionic level infarction (caudate, lentiform nuclei, internal capsule, insula, M1-M3 cortex): 7 - Supraganglionic infarction (M4-M6 cortex): 3 Total score (0-10 with 10 being normal): 10 IMPRESSION: 1. No acute intracranial infarct or other abnormality. 2. ASPECTS is 10. 3. Mild age-related cerebral atrophy with chronic small vessel ischemic disease. These results were communicated to Dr. Rory Percy at [facility] 7/26/2021by text page via the [facility] messaging system. Electronically Signed   By: Jeannine Boga M.D.   On: 09/23/2019 19:48  Medications:  Prior to Admission:  Medications Prior to Admission  Medication Sig Dispense Refill Last Dose  . atropine 1 % ophthalmic solution For drooling,these drops can be diluted 29ml in 115ml of water and used as a mouth rinse up to three times a day. You can also try 2 drops under the tongue up to 3 times a day without diluting it if that works better for you (Patient taking differently: Place 1-2 drops under the tongue See admin instructions. "For drooling, these drops can be diluted 1 ml in 100 ml's of water and used as a mouth rinse up to three times a day. You can also try 2 drops under the tongue up to 3 times a day without diluting it if that works better for you.") 15 mL 12 09/23/2019 at Unknown time  . bisacodyl (BISACODYL) 5 MG EC tablet Take 5 mg by mouth at bedtime as needed for mild constipation or moderate constipation.   Past Month at Unknown time  . calcium carbonate (TUMS EX) 750 MG chewable tablet Chew 1 tablet by mouth as needed for heartburn (or reflux).   unk at unk  . carbidopa-levodopa (SINEMET IR) 25-100 MG tablet Take 1 tablet three times a day with meals (Patient taking differently: Take 1 tablet by mouth See admin instructions. Take 1 tablet by mouth three times a day with meals- 9:30 AM, 1 PM, and 6 PM) 90 tablet 11 09/23/2019 at 1300  . donepezil (ARICEPT) 10 MG tablet Take 1/2 tablet daily for 2 weeks, then increase  to 1 tablet daily (Patient taking differently: Take 10 mg by mouth in the morning. ) 30 tablet 11 09/23/2019 at am  . mirtazapine (REMERON) 15 MG tablet TAKE 2 TABLETS BY MOUTH AT BEDTIME (Patient taking differently: Take 30 mg by mouth at bedtime. ) 180 tablet 3 09/22/2019 at pm  . Multiple Vitamins-Minerals (ONE-A-DAY WOMENS 50 PLUS) TABS Take 1 tablet by mouth daily with breakfast.   09/23/2019 at am  . oxymetazoline (AFRIN) 0.05 % nasal spray Place 1 spray into both nostrils 2 (two) times daily as needed for congestion.   unk at [employer]  . oxymetazoline (VICKS SINEX SEVERE DECONGEST) 0.05 % nasal spray Place 1 spray into both nostrils 2 (two) times daily as needed for congestion.   unk at unk   Scheduled: .  stroke: mapping our early stages of recovery book   Does not apply Once  . aspirin  300 mg Rectal Daily   Or  . aspirin  325 mg Oral Daily  . enoxaparin (LOVENOX) injection  40 mg Subcutaneous Q24H   Continuous: . sodium chloride 50 mL/hr at 09/24/19 0407   MRI brain: No acute intracranial abnormality. Mildly advanced age-related cerebral atrophy  EEG-currently running  Assessment: 69 year old female with a PMHx of advanced Parkinsonism and breast cancer, brought in for sudden onset of inability to communicate and talk. --Unclear what her baseline communication status is to begin with but based on her neurological notes from a couple of months ago, she appears to have been having a lot of trouble talking, with slurred speech as well as hallucinations and unresponsiveness during the times of hallucinations. --She has also been having a lot of trouble with dysphagia for which she is being worked up as an outpatient. -- MRI was negative for acute stroke or other focal lesion. Mildly advanced age-related cerebral atrophy was noted.  --Her examination at the time of initial neurological consultation was nonfocal and was more consistent with someone  with an advanced neurodegenerative  process. --Current differentials include toxic metabolic encephalopathy, worsening baseline cognitive deficits, advanced Parkinson's disease with cognitive fluctuation, subclinical seizure with postictal state, possible aspiration pneumonia versus UTI. -- Exam today significantly improved. Given rapid improvement in her exam overnight, Parkinson's disease with cognitive fluctuation is now highest on the differential diagnosis for her presentation..   Recommendations: --Toxic/metabolic/infectious work up -- EEG is currently running -- No antiepileptics for now -- Continue home Sinemet. -- PT/OT    LOS: 0 days   @Electronically  signed: Dr. Kerney Elbe 09/24/2019  7:23 AM

## 2019-09-24 NOTE — Evaluation (Signed)
Physical Therapy Evaluation Patient Details Name: Kellie Moore MRN: [id number] DOB: 1950/10/22 Today's Date: 09/24/2019   History of Present Illness  Pt is a 69 y.o. female with PMH of parkinson's disease, dementia, and breast cancer presenting to ER after pt became less responsive with difficulty speaking. CT of head and EEG is unremarkable for infarct or seizure.  Clinical Impression  Pt admitted with above diagnosis. At the time of PT eval pt was able to perform transfers and ambulation with gross min guard assist to supervision for safety and no AD. Pt is likely at or near baseline of function and will have 24 hour assist at home upon d/c. Pt scored 17/24 on the DGI indicating she is at a higher risk for falls compared to other community dwelling older adults. Will continue to keep on caseload while admitted to monitor progress with mobility. Pt currently with functional limitations due to the deficits listed below (see PT Problem List). Pt will benefit from skilled PT to increase their independence and safety with mobility to allow discharge to the venue listed below.       Follow Up Recommendations No PT follow up;Supervision for mobility/OOB    Equipment Recommendations  None recommended by PT    Recommendations for Other Services       Precautions / Restrictions Precautions Precautions: Fall Restrictions Weight Bearing Restrictions: No      Mobility  Bed Mobility Overal bed mobility: Needs Assistance Bed Mobility: Supine to Sit     Supine to sit: HOB elevated;Supervision     General bed mobility comments: supervision for safety, pt required increased verbal cues due to added distraction in room  Transfers Overall transfer level: Needs assistance Equipment used: 1 person hand held assist Transfers: Sit to/from Stand Sit to Stand: Min guard Stand pivot transfers: Min assist       General transfer comment: pt required HHA and min guard assist for safety with  sit<>stand   Ambulation/Gait Ambulation/Gait assistance: Min guard Gait Distance (Feet): 200 Feet Assistive device: None Gait Pattern/deviations: Step-through pattern;Decreased step length - right;Decreased step length - left;Decreased dorsiflexion - right;Decreased dorsiflexion - left Gait velocity: decreased Gait velocity interpretation: <1.8 ft/sec, indicate of risk for recurrent falls General Gait Details: pt demonstrated decreased step length along with decreased DF with bilateral LE. Pt cued for longer steps and pull toes up towards ceiling  Stairs            Wheelchair Mobility    Modified Rankin (Stroke Patients Only)       Balance Overall balance assessment: Needs assistance Sitting-balance support: No upper extremity supported;Feet supported Sitting balance-Leahy Scale: Fair Sitting balance - Comments: min guard   Standing balance support: Single extremity supported;During functional activity Standing balance-Leahy Scale: Fair Standing balance comment: Occasional assist provided.                  Standardized Balance Assessment Standardized Balance Assessment : Dynamic Gait Index   Dynamic Gait Index Level Surface: Mild Impairment Change in Gait Speed: Mild Impairment Gait with Horizontal Head Turns: Mild Impairment Gait with Vertical Head Turns: Mild Impairment Gait and Pivot Turn: Normal Step Over Obstacle: Mild Impairment Step Around Obstacles: Mild Impairment Steps: Mild Impairment Total Score: 17       Pertinent Vitals/Pain Pain Assessment: No/denies pain    Home Living Family/patient expects to be discharged to:: Private residence Living Arrangements: Spouse/significant other Available Help at Discharge: Family;Available 24 hours/day Type of Home: House Home Access: Stairs to  enter   Entrance Stairs-Number of Steps: 5 Home Layout: Laundry or work area in basement;One level;Other (Comment) (pt no longer allowed to go into  basement) Home Equipment: Walker - 2 wheels;Tub bench Additional Comments: spouse provides 24/7 assist and kids come by when husband goes shopping or golfing    Prior Function Level of Independence: Needs assistance   Gait / Transfers Assistance Needed: Pt sometimes using RW for ambulation with increased fatigue  ADL's / Homemaking Assistance Needed: Pt needing supervision for all ADLs - especially in shower and with steps  Comments: Husband present at end of eval to confirm PLOF     Hand Dominance   Dominant Hand: Right    Extremity/Trunk Assessment   Upper Extremity Assessment Upper Extremity Assessment: Defer to OT evaluation    Lower Extremity Assessment Lower Extremity Assessment: Generalized weakness (grossly -4/5, except L Hip flex +3/5 and bilateral DF +3/5)    Cervical / Trunk Assessment Cervical / Trunk Assessment: Kyphotic  Communication   Communication: Expressive difficulties (difficulty with word finding)  Cognition Arousal/Alertness: Awake/alert Behavior During Therapy: WFL for tasks assessed/performed Overall Cognitive Status: History of cognitive impairments - at baseline Area of Impairment: Attention;Memory;Safety/judgement;Problem solving;Awareness                   Current Attention Level: Sustained Memory: Decreased recall of precautions Following Commands: Follows one step commands consistently;Follows multi-step commands inconsistently Safety/Judgement: Decreased awareness of safety Awareness: Emergent Problem Solving: Difficulty sequencing;Requires verbal cues;Requires tactile cues General Comments: Pt with increased anxiety and confusion. RN reports she has been asking to get on and off toilet all day with no success. Pt trying to get EOB upon arrival explaining she needed to use the bathroom again.       General Comments General comments (skin integrity, edema, etc.): Pt successful completeing BM on BSC after multiple attempts. Husband  present at end of eval to confirm PLOF and home set up    Exercises     Assessment/Plan    PT Assessment Patient needs continued PT services  PT Problem List Decreased strength;Decreased activity tolerance;Decreased balance;Decreased mobility;Decreased cognition;Decreased knowledge of use of DME;Decreased safety awareness;Decreased knowledge of precautions       PT Treatment Interventions DME instruction;Gait training;Stair training;Functional mobility training;Therapeutic activities;Therapeutic exercise;Neuromuscular re-education;Patient/family education    PT Goals (Current goals can be found in the Care Plan section)  Acute Rehab PT Goals Patient Stated Goal: get better PT Goal Formulation: With patient Time For Goal Achievement: 10/01/19 Potential to Achieve Goals: Good    Frequency Min 3X/week   Barriers to discharge        Co-evaluation               AM-PAC PT "6 Clicks" Mobility  Outcome Measure Help needed turning from your back to your side while in a flat bed without using bedrails?: None Help needed moving from lying on your back to sitting on the side of a flat bed without using bedrails?: None Help needed moving to and from a bed to a chair (including a wheelchair)?: None Help needed standing up from a chair using your arms (e.g., wheelchair or bedside chair)?: None Help needed to walk in hospital room?: A Little Help needed climbing 3-5 steps with a railing? : A Little 6 Click Score: 22    End of Session Equipment Utilized During Treatment: Gait belt Activity Tolerance: Patient tolerated treatment well Patient left: in chair;with call bell/phone within reach;with chair alarm set Nurse Communication: Mobility  status PT Visit Diagnosis: Unsteadiness on feet (R26.81);Muscle weakness (generalized) (M62.81)    Time: 5015-8682 PT Time Calculation (min) (ACUTE ONLY): 32 min   Charges:   PT Evaluation $PT Eval Low Complexity: 1 Low PT Treatments $Gait  Training: 8-22 mins        Rolinda Roan, PT, DPT Acute Rehabilitation Services Pager: [phone] Office: [phone]   Thelma Comp 09/24/2019, 3:36 PM

## 2019-09-24 NOTE — Evaluation (Signed)
Occupational Therapy Evaluation Patient Details Name: Kellie Moore MRN: [id number] DOB: 03-02-1950 Today's Date: 09/24/2019    History of Present Illness Pt is a 69 y.o. female with PMH of parkinson's disease, dementia, and breast cancer presenting to ER after pt became less responsive with difficulty speaking. CT of head and EEG is unremarkable for infarct or seizure.   Clinical Impression   PTA pt's husband reports that pt needed walker for mobility and supervision during all ADLs. Pt was admitted for above and treated for problem list below (see OT Problem List). Pt oriented to self with increased anxiety and confusion. RN reports she has been asking to get on and off toilet all day with no success. Pt trying to get EOB upon arrival explaining she needed to use the bathroom again. Requires supervision - max A with verbal cues for sequencing with ADLs due to decreased cognition, decreased safety, and weakness. Requires min guard - min A with transfers due to decreased balance and weakness.  Pt's husband present to confirm PLOF and home set up at end of eval. Believe pt would benefit from skilled OT services acutely and at the [facility] level with 24/7 assist/supervision to increase safety and independence with ADLs.  *This evaluation was conducted under a supervising OTR/L who assisted in evaluation and documentation.     Follow Up Recommendations  Home health OT;Supervision/Assistance - 24 hour    Equipment Recommendations  None recommended by OT       Precautions / Restrictions Precautions Precautions: Fall      Mobility Bed Mobility Overal bed mobility: Needs Assistance Bed Mobility: Supine to Sit     Supine to sit: Supervision;HOB elevated     General bed mobility comments: supervision for safety   Transfers Overall transfer level: Needs assistance Equipment used: 1 person hand held assist Transfers: Stand Pivot Transfers;Sit to/from Stand Sit to Stand: Min guard Stand pivot  transfers: Min assist       General transfer comment: Min A for decreased balance with turning    Balance Overall balance assessment: Needs assistance Sitting-balance support: No upper extremity supported;Feet supported Sitting balance-Leahy Scale: Fair Sitting balance - Comments: min guard   Standing balance support: Single extremity supported;During functional activity Standing balance-Leahy Scale: Poor Standing balance comment: needs support                           ADL either performed or assessed with clinical judgement   ADL Overall ADL's : Needs assistance/impaired     Grooming: Set up;Sitting;Wash/dry hands Grooming Details (indicate cue type and reason): set up with good sitting balance Upper Body Bathing: Sitting;Supervision/ safety;Cueing for sequencing;Cueing for safety Upper Body Bathing Details (indicate cue type and reason): supervision for safety Lower Body Bathing: Min guard;Sit to/from stand;Sitting/lateral leans Lower Body Bathing Details (indicate cue type and reason): min guard for safety Upper Body Dressing : Supervision/safety;Sitting;Cueing for sequencing;Cueing for safety Upper Body Dressing Details (indicate cue type and reason): supervision for safety Lower Body Dressing: Min guard;Sitting/lateral leans;Sit to/from stand;Cueing for safety;Cueing for sequencing Lower Body Dressing Details (indicate cue type and reason): min guard for safety Toilet Transfer: Min Statistician Details (indicate cue type and reason): Min guard for safety and line managment Toileting- Clothing Manipulation and Hygiene: Maximal assistance;Sit to/from stand;Sitting/lateral lean;Cueing for safety;Cueing for sequencing Toileting - Clothing Manipulation Details (indicate cue type and reason): Max A due to cognition and weakness. Pt attempting to clean herself without success  Tub/Shower Transfer Details (indicate cue type and reason): deferred  due to pt safety Functional mobility during ADLs: Min guard;Minimal assistance General ADL Comments: Pt with increased confusion, decreased cognition, decreased safety awareness, and weakness     Vision Baseline Vision/History: Wears glasses Wears Glasses: At all times Patient Visual Report: No change from baseline              Pertinent Vitals/Pain Pain Assessment: No/denies pain     Hand Dominance Right   Extremity/Trunk Assessment Upper Extremity Assessment Upper Extremity Assessment: Generalized weakness   Lower Extremity Assessment Lower Extremity Assessment: Defer to PT evaluation   Cervical / Trunk Assessment Cervical / Trunk Assessment: Kyphotic   Communication Communication Communication: Expressive difficulties (difficulty with word finding)   Cognition Arousal/Alertness: Awake/alert Behavior During Therapy: Anxious Overall Cognitive Status: Impaired/Different from baseline Area of Impairment: Attention;Memory;Following commands;Safety/judgement;Awareness;Problem solving                   Current Attention Level: Sustained Memory: Decreased recall of precautions Following Commands: Follows one step commands inconsistently Safety/Judgement: Decreased awareness of safety Awareness: Emergent Problem Solving: Difficulty sequencing;Requires verbal cues;Requires tactile cues General Comments: Pt with increased anxiety and confusion. RN reports she has been asking to get on and off toilet all day with no success. Pt trying to get EOB upon arrival explaining she needed to use the bathroom again.    General Comments  Pt successful completeing BM on BSC after multiple attempts. Husband present at end of eval to confirm PLOF and home set up            [place] expects to be discharged to:: Private residence Living Arrangements: Spouse/significant other Available Help at Discharge: Family;Available 24 hours/day Type of Home: House Home  Access: Stairs to enter [employer] of Steps: 5   Home Layout: Laundry or work area in basement;One level;Other (Comment) (pt no longer allowed to go into basement)     Bathroom Shower/Tub: Tub/shower unit   Bathroom Toilet: Standard     Home Equipment: Environmental consultant - 2 wheels;Tub bench   Additional Comments: spouse provides 24/7 assist and kids come by when husband goes shopping or golfing  Lives With: Spouse    Prior Functioning/Environment Level of Independence: Needs assistance  Gait / Transfers Assistance Needed: Pt sometimes using RW for ambulation with increased fatigue ADL's / Homemaking Assistance Needed: Pt needing supervision for all ADLs - especially in shower and with steps   Comments: Husband present at end of eval to confirm PLOF        OT Problem List: Decreased strength;Decreased activity tolerance;Impaired balance (sitting and/or standing);Decreased coordination;Decreased cognition;Decreased safety awareness;Decreased knowledge of use of DME or AE;Decreased knowledge of precautions      OT Treatment/Interventions: Self-care/ADL training;Therapeutic exercise;Energy conservation;DME and/or AE instruction;Therapeutic activities;Cognitive remediation/compensation;Patient/family education;Balance training    OT Goals(Current goals can be found in the care plan section) Acute Rehab OT Goals Patient Stated Goal: get better OT Goal Formulation: With patient Time For Goal Achievement: 10/08/19 Potential to Achieve Goals: Good  OT Frequency: Min 2X/week    AM-PAC OT "6 Clicks" Daily Activity     Outcome Measure Help from another person eating meals?: None Help from another person taking care of personal grooming?: A Little Help from another person toileting, which includes using toliet, bedpan, or urinal?: A Lot Help from another person bathing (including washing, rinsing, drying)?: A Little Help from another person to put on and taking off regular upper body  clothing?:  A Little Help from another person to put on and taking off regular lower body clothing?: A Little 6 Click Score: 18   End of Session Equipment Utilized During Treatment: Gait belt Nurse Communication: Mobility status  Activity Tolerance: Patient tolerated treatment well Patient left: in bed;with call bell/phone within reach;with bed alarm set;with family/visitor present  OT Visit Diagnosis: Unsteadiness on feet (R26.81);Muscle weakness (generalized) (M62.81);Other symptoms and signs involving cognitive function                Time: 5364-6803 OT Time Calculation (min): 23 min Charges:  OT General Charges $OT Visit: 1 Visit OT Evaluation $OT Eval Moderate Complexity: 1 Mod OT Treatments $Self Care/Home Management : 8-22 mins  Kaysa Roulhac/OTS  Melyssa Signor 09/24/2019, 12:46 PM

## 2019-09-24 NOTE — Procedures (Signed)
Patient Name: Kellie Moore  MRN: [id number]  Epilepsy Attending: Lora Havens  Referring Physician/Provider: Dr Gean Birchwood Date: 09/24/2019 Duration: 24.46 mins  Patient history: 69 year old with a past history of advanced Parkinsonism and breast cancer, brought in for sudden onset of inability to communicate and talk. EEG to evaluate for seizure  Level of alertness: Awake  AEDs during EEG study: None  Technical aspects: This EEG study was done with scalp electrodes positioned according to the 10-20 International system of electrode placement. Electrical activity was acquired at a sampling rate of 500Hz  and reviewed with a high frequency filter of 70Hz  and a low frequency filter of 1Hz . EEG data were recorded continuously and digitally stored.   Description: The posterior dominant rhythm consists of 9Hz  activity of moderate voltage (25-35 uV) seen predominantly in posterior head regions, symmetric and reactive to eye opening and eye closing. Physiology photic driving was not seen during photic stimulation. Hyperventilation was not performed.     IMPRESSION: This study is within normal limits. No seizures or epileptiform discharges were seen throughout the recording.  Aldona Bryner Barbra Sarks

## 2019-09-24 NOTE — Evaluation (Signed)
Clinical/Bedside Swallow Evaluation Patient Details  Name: Kellie Moore MRN: [id number] Date of Birth: June 13, 1950  Today's Date: 09/24/2019 Time: SLP Start Time (ACUTE ONLY): 54 SLP Stop Time (ACUTE ONLY): 1030 SLP Time Calculation (min) (ACUTE ONLY): 10 min  Past Medical History:  Past Medical History:  Diagnosis Date  . Cancer [facility])    Breast cancer   Past Surgical History:  Past Surgical History:  Procedure Laterality Date  . BREAST LUMPECTOMY Right    radiation   HPI:  Pt is a 69 yo female with PMH + for Parkinson's disease diagnosed approx 2-3 years ago with symptoms occuring 2-3 years before diagnosis.  Pt also with h/o anxiety, frequent falls in the last few months, breast cancer s/p lumpectomy/XRT, sialorrhea s/p atropine drop treatment, dysphagia.  Pt has lost weight to 132 down to 94 pounds over the last 3 1/2 to 4 years.  She has poor appetite but does not report food tasting bad.  Per spouse, pt consumes 2 1/2 meals daily.  She can drink down Boost but takes few small sips of water with "gulping" sound.  Pt and spouse deny pt having pneumonias, requiring heimlich manuever nor recent weight loss.  She mostly eats softer foods. In hospital with acute change in mentation and dysarthria--now resolved.   Assessment / Plan / Recommendation Clinical Impression  Kellie Moore demonstrates functional oropharyngeal swallow, consistent with recent findings on modified barium swallow study completed as an outpatient on 09/20/19. She is presently hospitalized with acute encephalopathy, which does not appear to be impacting swallow function. Oral mech exam revealed mild mandibular tremor, mild L facial weakness, and fast rate of speech. She consumed soft solid (banana) and thin liquid water via straw without s/s aspiration or difficulty otherwise. Swallow strategies from modified barium swallow study were reviewed with her (small single sips and intermittent cough and re-swallow). Pt had poor  recall of these recommendations from a few days ago with known history of dementia. No further ST indicated for dysphagia on acute level. See cognitive-linguistic evaluation note for further details.     SLP Visit Diagnosis: Dysphagia, unspecified (R13.10)    Aspiration Risk  Mild aspiration risk    Diet Recommendation Dysphagia 3 (Mech soft);Thin liquid   Liquid Administration via: Straw;Cup Medication Administration: Whole meds with pureed Supervision: Patient able to self feed Compensations: Small sips/bites Postural Changes: Seated upright at 90 degrees    Other  Recommendations Oral Care Recommendations: Oral care BID   Follow up Recommendations Outpatient Speech Therapy for speech and swallow function      Frequency and Duration            Prognosis        Swallow Study   General Date of Onset: 09/19/16 HPI: Pt is a 69 yo female with PMH + for Parkinson's disease diagnosed approx 2-3 years ago with symptoms occuring 2-3 years before diagnosis.  Pt also with h/o anxiety, frequent falls in the last few months, breast cancer s/p lumpectomy/XRT, sialorrhea s/p atropine drop treatment, dysphagia.  Pt has lost weight to 132 down to 94 pounds over the last 3 1/2 to 4 years.  She has poor appetite but does not report food tasting bad.  Per spouse, pt consumes 2 1/2 meals daily.  She can drink down Boost but takes few small sips of water with "gulping" sound.  Pt and spouse deny pt having pneumonias, requiring heimlich manuever nor recent weight loss.  She mostly eats softer foods. In hospital with  acute change in mentation and dysarthria--now resolved. Type of Study: Bedside Swallow Evaluation Previous Swallow Assessment: MBSS completed 4 days ago recommending thin liquids and mechanically soft solids Diet Prior to this Study: Dysphagia 3 (soft);Thin liquids Temperature Spikes Noted: No Respiratory Status: Room air History of Recent Intubation: No Behavior/Cognition:  Alert;Cooperative;Pleasant mood Oral Cavity Assessment: Within Functional Limits Oral Care Completed by SLP: No Oral Cavity - Dentition: Adequate natural dentition Vision: Functional for self-feeding Self-Feeding Abilities: Able to feed self Patient Positioning: Upright in bed Baseline Vocal Quality: Low vocal intensity Volitional Cough: Weak Volitional Swallow: Able to elicit    Oral/Motor/Sensory Function Overall Oral Motor/Sensory Function: Within functional limits   Ice Chips     Thin Liquid Thin Liquid: Within functional limits Presentation: Straw    Solid     Solid: Within functional limits Presentation: Self Fed Other Comments: banana     Kellie Moore P. Kellie Moore, M.S., CCC-SLP Speech-Language Pathologist Acute Rehabilitation Services Pager: [facility] 09/24/2019,11:15 AM

## 2019-09-24 NOTE — Discharge Summary (Signed)
Physician Discharge Summary  Kellie Moore RSW:[id number] DOB: 1950-05-21 DOA: 09/23/2019  PCP: Wenda Low, MD  Admit date: 09/23/2019 Discharge date: 09/24/2019  Admitted From: Home Disposition:  Home  Discharge Condition:Stable CODE STATUS:FULL Diet recommendation: Heart Healthy    Brief/Interim Summary: Patient is a 69 year old female with history of Parkinson disease, dementia, history of breast cancer who was brought to the emergency department after she was found to be less responsive, dysarthric.  She was not following commands and did not talk so she was brought to the emergency department.  As per the family, she usually walks without any help but will need help to go into the back and other activities of daily living. She is confused at baseline. Brain imagings done in the emergency department did not show any acute intracranial abnormalities.  EEG did not show any seizures.  Her mental status returned to baseline after admission.  As per family's request, she has been discharged home today.  She is medically stable for discharge.  Following problems were addressed during hospitalization:  Acute encephalopathy: MRI of the brain, CT head did not show any acute intracranial normalities.  EEG did not show any seizures or epileptiform discharges.  UA not impressive for urinary tract infection.  Monitor mental status. PT/OT/speech consultation done.    Recommended outpatient follow-up Looks like her mental status is back to baseline.  History of Parkinson's disease/dementia: On Sinemet.  Also on Aricept.  Continue supportive care. .  She is confused at baseline and needs support for daily activities.  History of depression: Takes mirtazapine which will be continued.   Discharge Diagnoses:  Principal Problem:   Acute encephalopathy Active Problems:   Parkinson's disease ([place])   Dementia [facility])    Discharge Instructions  Discharge Instructions    Ambulatory referral to  Occupational Therapy   Complete by: As directed    Ambulatory referral to Speech Therapy   Complete by: As directed    Diet - low sodium heart healthy   Complete by: As directed    Discharge instructions   Complete by: As directed    1) Please follow up with your PCP in a week. 2)Follow up with outpatient occupational and speech therapy.   Increase activity slowly   Complete by: As directed      Allergies as of 09/24/2019      Reactions   Other Itching, Other (See Comments)   Dust and pollen = Itchy eyes and runny nose      Medication List    TAKE these medications   atropine 1 % ophthalmic solution For drooling,these drops can be diluted 9ml in 184ml of water and used as a mouth rinse up to three times a day. You can also try 2 drops under the tongue up to 3 times a day without diluting it if that works better for you What changed:   how much to take  how to take this  when to take this  additional instructions   bisacodyl 5 MG EC tablet Generic drug: bisacodyl Take 5 mg by mouth at bedtime as needed for mild constipation or moderate constipation.   calcium carbonate 750 MG chewable tablet Commonly known as: TUMS EX Chew 1 tablet by mouth as needed for heartburn (or reflux).   carbidopa-levodopa 25-100 MG tablet Commonly known as: SINEMET IR Take 1 tablet three times a day with meals What changed:   how much to take  how to take this  when to take this  additional instructions   donepezil 10 MG tablet Commonly known as: ARICEPT Take 1/2 tablet daily for 2 weeks, then increase to 1 tablet daily What changed:   how much to take  how to take this  when to take this  additional instructions   mirtazapine 15 MG tablet Commonly known as: REMERON TAKE 2 TABLETS BY MOUTH AT BEDTIME What changed:   how much to take  how to take this  when to take this  additional instructions   One-A-Day Womens 50 Plus Tabs Take 1 tablet by mouth daily with  breakfast.   oxymetazoline 0.05 % nasal spray Commonly known as: AFRIN Place 1 spray into both nostrils 2 (two) times daily as needed for congestion.   Vicks Sinex Severe Decongest 0.05 % nasal spray Generic drug: oxymetazoline Place 1 spray into both nostrils 2 (two) times daily as needed for congestion.       Follow-up Information    [facility].   Specialty: Rehabilitation Why: The outpatient therapy will contact you for the first appointment. Contact information: [address] [place] [id number] [place]       Wenda Low, MD. Schedule an appointment as soon as possible for a visit in 1 week(s).   Specialty: Internal Medicine Contact information: [address] [place]  [place] [address] [phone]              Allergies  Allergen Reactions  . Other Itching and Other (See Comments)    Dust and pollen = Itchy eyes and runny nose    Consultations:  None   Procedures/Studies: EEG  Result Date: 09/24/2019 Lora Havens, MD     09/24/2019 10:37 AM Patient Name: Kellie Moore MRN: [id number] Epilepsy Attending: Lora Havens Referring Physician/Provider: Dr Gean Birchwood Date: 09/24/2019 Duration: 24.46 mins Patient history: 69 year old with a past history of advanced Parkinsonism and breast cancer, brought in for sudden onset of inability to communicate and talk. EEG to evaluate for seizure Level of alertness: Awake AEDs during EEG study: None Technical aspects: This EEG study was done with scalp electrodes positioned according to the 10-20 International system of electrode placement. Electrical activity was acquired at a sampling rate of 500Hz  and reviewed with a high frequency filter of 70Hz  and a low frequency filter of 1Hz . EEG data were recorded continuously and digitally stored. Description: The posterior dominant rhythm consists of 9Hz  activity  of moderate voltage (25-35 uV) seen predominantly in posterior head regions, symmetric and reactive to eye opening and eye closing. Physiology photic driving was not seen during photic stimulation. Hyperventilation was not performed.   IMPRESSION: This study is within normal limits. No seizures or epileptiform discharges were seen throughout the recording. Lora Havens   MR BRAIN WO CONTRAST  Result Date: 09/24/2019 CLINICAL DATA:  Initial evaluation for acute mental status change. EXAM: MRI HEAD WITHOUT CONTRAST TECHNIQUE: Multiplanar, multiecho pulse sequences of the brain and surrounding structures were obtained without intravenous contrast. COMPARISON:  Prior CT from 09/23/2019. FINDINGS: Brain: Examination degraded by motion artifact. Mildly advanced age-related cerebral atrophy. No significant cerebral white matter disease for age. No abnormal foci of restricted diffusion to suggest acute or subacute ischemia. Gray-white matter differentiation maintained. No encephalomalacia to suggest chronic cortical infarction. No foci of susceptibility artifact to suggest acute or chronic intracranial hemorrhage. No mass lesion, midline shift or mass effect. No hydrocephalus or extra-axial fluid collection. Pituitary gland suprasellar region normal. Midline structures  intact. Vascular: Major intracranial vascular flow voids are maintained. Skull and upper cervical spine: Craniocervical junction within normal limits. Bone marrow signal intensity normal. No scalp soft tissue abnormality. Sinuses/Orbits: Globes and orbital soft tissues within normal limits. Paranasal sinuses are clear. No mastoid effusion. Other: None. IMPRESSION: 1. No acute intracranial abnormality. 2. Mildly advanced age-related cerebral atrophy. Electronically Signed   By: Jeannine Boga M.D.   On: 09/24/2019 02:26   DG Carlena Hurl OP MEDICARE SPEECH PATH  Result Date: 09/20/2019 Objective Swallowing Evaluation: Type of Study:  MBS-Modified Barium Swallow Study  Patient Details Name: Kellie Moore MRN: [id number] Date of Birth: 10-14-1950 Today's Date: 09/20/2019 Time: SLP Start Time (ACUTE ONLY): 1310 -SLP Stop Time (ACUTE ONLY): 1350 SLP Time Calculation (min) (ACUTE ONLY): 40 min Past Medical History: Past Medical History: Diagnosis Date . Cancer [facility])   Breast cancer Past Surgical History: Past Surgical History: Procedure Laterality Date . BREAST LUMPECTOMY Right   radiation HPI: Pt is a 69 yo female referred for OP MBS.  Pt PMH + for Parkinson's disease diagnosed approx 2-3 years ago with symptoms occuring 2-3 years before diagnosis.  Pt also with h/o anxiety, frequent falls in the last few months, breast cancer s/p lumpectomy/XRT, sialorrhea s/p atropine drop treatment, dysphagia.  Pt has lost weight to 132 down to 94 pounds over the last 3 1/2 to 4 years.  She has poor appetite but does not report food tasting bad.  Per spouse, pt consumes 2 1/2 meals daily.  She can drink down Boost but takes few small sips of water with "gulping" sound.  Pt and spouse deny pt having pneumonias, requiring heimlich manuever nor recent weight loss.  She mostly eats softer foods.   Subjective: pt awake with spouse Tom present Assessment / Plan / Recommendation CHL IP CLINICAL IMPRESSIONS 09/20/2019 Clinical Impression Patient presents with mild oropharyngeal dysphagia with sensorimotor deficits due to her Parkinson's disease.  Oral control c/b decreased coordination with occasional lingual pumping, premature spillage and decreased bolus cohesion. When patient conducts sequential swallows, she allows spillage of next boluses into pharynx with ill timing of laryngeal closure/swallow which results in laryngeal penetration and aspiration.  Chin tuck posture and head neutral with SMALL SINGLE boluses were protective with aspiration.  Cued cough largely effective to clear trace aspirates.  Deeper (silent) aspiration noted with larger sequential swallows of nectar  with chin tuck posture.   Pharyngeal swallow is strong without retention of pudding, cracker nor tablet taken with pudding.  Mild retention in pharynx noted with thin/nectar causing SLP to question if patient has esophageal pressure issues.   Intermittent delay in pharyngeal swallow initiation noted - with puree swallow triggered at six seconds at vallecula space.  Decreased oral control combined with decreased timing of laryngeal closure/reflexive swallow- allows mild aspiration of liquids.  Recommend patient consume small single sips and take medicine with puree- whole.  Reviewed recommendation to strengthen cough to clear airway and "hock" to clear pharynx of possible retention.  Pt would benefit from follow up SLP as OP to address her dysphagia including timing of swallow, strengthening oropharyngeal musculature including implementing RMST (Respiratory Muscle Strength Training) to improve pulmonary clearance of minimal aspirates.  Suspect she has a component of chronic low-grade aspiration that she is managing but maintenance if immune system is impaired.   Of note, patient does present with rapid rate of imprecise speech and SLP would be helpful for dysarthria as well.  Thanks for this consult.   SLP Visit  Diagnosis Dysphagia, oropharyngeal phase (R13.12) Attention and concentration deficit following -- Frontal lobe and executive function deficit following -- Impact on safety and function Mild aspiration risk   No flowsheet data found.  No flowsheet data found. CHL IP DIET RECOMMENDATION 09/20/2019 SLP Diet Recommendations Dysphagia 3 (Mech soft) solids;Thin liquid Liquid Administration via Cup;Straw Medication Administration Whole meds with puree Compensations Slow rate;Small sips/bites;Effortful swallow Postural Changes Remain semi-upright after after feeds/meals (Comment);Seated upright at 90 degrees   CHL IP OTHER RECOMMENDATIONS 09/20/2019 Recommended Consults -- Oral Care Recommendations Oral care BID Other  Recommendations --   No flowsheet data found.  No flowsheet data found.     CHL IP ORAL PHASE 09/20/2019 Oral Phase Impaired Oral - Pudding Teaspoon -- Oral - Pudding Cup -- Oral - Honey Teaspoon -- Oral - Honey Cup -- Oral - Nectar Teaspoon -- Oral - Nectar Cup Weak lingual manipulation;Decreased bolus cohesion;Premature spillage Oral - Nectar Straw Weak lingual manipulation;Decreased bolus cohesion;Premature spillage Oral - Thin Teaspoon -- Oral - Thin Cup Weak lingual manipulation;Decreased bolus cohesion;Premature spillage Oral - Thin Straw Weak lingual manipulation;Lingual pumping;Decreased bolus cohesion;Premature spillage Oral - Puree Weak lingual manipulation;Premature spillage Oral - Mech Soft Weak lingual manipulation;Decreased bolus cohesion;Premature spillage Oral - Regular -- Oral - Multi-Consistency -- Oral - Pill Lingual pumping;Decreased bolus cohesion;Premature spillage Oral Phase - Comment --  CHL IP PHARYNGEAL PHASE 09/20/2019 Pharyngeal Phase Impaired Pharyngeal- Pudding Teaspoon -- Pharyngeal -- Pharyngeal- Pudding Cup -- Pharyngeal -- Pharyngeal- Honey Teaspoon -- Pharyngeal -- Pharyngeal- Honey Cup -- Pharyngeal -- Pharyngeal- Nectar Teaspoon -- Pharyngeal -- Pharyngeal- Nectar Cup Delayed swallow initiation-vallecula;Reduced epiglottic inversion;Penetration/Aspiration during swallow;Pharyngeal residue - valleculae Pharyngeal Material enters airway, remains ABOVE vocal cords and not ejected out Pharyngeal- Nectar Straw Delayed swallow initiation-vallecula;Reduced epiglottic inversion;Penetration/Aspiration during swallow;Pharyngeal residue - valleculae Pharyngeal -- Pharyngeal- Thin Teaspoon -- Pharyngeal -- Pharyngeal- Thin Cup Delayed swallow initiation-vallecula;Reduced epiglottic inversion;Penetration/Aspiration during swallow;Penetration/Apiration after swallow Pharyngeal Material enters airway, passes BELOW cords without attempt by patient to eject out (silent aspiration) Pharyngeal- Thin  Straw Reduced epiglottic inversion;Penetration/Aspiration during swallow;Penetration/Apiration after swallow;Trace aspiration;Pharyngeal residue - valleculae Pharyngeal Material enters airway, CONTACTS cords and not ejected out;Material enters airway, passes BELOW cords without attempt by patient to eject out (silent aspiration) Pharyngeal- Puree WFL;Delayed swallow initiation-vallecula Pharyngeal -- Pharyngeal- Mechanical Soft WFL;Delayed swallow initiation-vallecula Pharyngeal -- Pharyngeal- Regular -- Pharyngeal -- Pharyngeal- Multi-consistency -- Pharyngeal -- Pharyngeal- Pill WFL;Delayed swallow initiation-vallecula Pharyngeal -- Pharyngeal Comment chin tuck tested, epiglottis curvature appears anterior decreasing vallecular space space intermittently, trace aspiration noted with thin and nectar due to oral bolus spilling into pharynx with poorly timed laryngeal closure  CHL IP CERVICAL ESOPHAGEAL PHASE 09/20/2019 Cervical Esophageal Phase WFL Pudding Teaspoon -- Pudding Cup -- Honey Teaspoon -- Honey Cup -- Nectar Teaspoon -- Nectar Cup -- Nectar Straw -- Thin Teaspoon -- Thin Cup -- Thin Straw -- Puree -- Mechanical Soft -- Regular -- Multi-consistency -- Pill -- Cervical Esophageal Comment -- Macario Golds 09/20/2019, 3:50 PM  Kathleen Lime, MS [facility] SLP Acute Rehab Services Office [phone]           CLINICAL DATA:  Difficulty swallowing, cough with history of Parkinson's EXAM: MODIFIED BARIUM SWALLOW TECHNIQUE: Different consistencies of barium were administered orally to the patient by the Speech Pathologist. Imaging of the pharynx was performed in the lateral projection. The radiologist was present in the fluoroscopy room for this study, providing personal supervision. FLUOROSCOPY TIME:  Fluoroscopy Time:  3 minutes 50 seconds Radiation Exposure Index (if provided by  the fluoroscopic device): 10.7 mGy Number of Acquired Spot Images: 1 COMPARISON:  Chest CT 06/19/2017 no prior swallow evaluations.  FINDINGS: Spinal degenerative changes are noted on the single lateral image at the initial portion of the study. Study was performed with thin liquids, nectar thick, puree consistency and puree with cracker. Silent aspiration was noted with thin liquids, small to moderate volume worse with swallowing of larger volumes of liquid with repeated swallows. Improvement with chin tuck maneuver and single swallows. Nectar thick liquids with small volume aspiration when sipping with straw. No signs of aspiration with nectar thick liquids when taken by mouth and smaller volumes. Thicker consistencies and cracker showed spillage prematurely to the vallecula and subsequently the Piriforms. This was associated with some delay of swallow trigger. There was normal hyoid elevation and epiglottic inversion. Soft palate elevation also unremarkable. IMPRESSION: 1. Silent aspiration with thin liquids. 2. Silent aspiration with nectar thick liquids administered through straw. 3. Delayed swallow trigger and spillage of all consistencies to the vallecula and piriform recesses. Please refer to the Speech Pathologists report for complete details and recommendations. Electronically Signed   By: Zetta Bills M.D.   On: 09/20/2019 14:36   CT HEAD CODE STROKE WO CONTRAST  Result Date: 09/23/2019 CLINICAL DATA:  Code stroke. Initial evaluation for acute aphasia. Six EXAM: CT HEAD WITHOUT CONTRAST TECHNIQUE: Contiguous axial images were obtained from the base of the skull through the vertex without intravenous contrast. COMPARISON:  Prior head CT from 08/22/2019. FINDINGS: Brain: Generalized age-related cerebral atrophy with probable mild chronic microvascular ischemic disease. No acute intracranial hemorrhage. No acute large vessel territory infarct. No mass lesion, midline shift or mass effect. No hydrocephalus or extra-axial fluid collection. Vascular: No hyperdense vessel. Scattered vascular calcifications noted within the carotid  siphons. Skull: Scalp soft tissues and calvarium within normal limits. Sinuses/Orbits: Globes and orbital soft tissues demonstrate no acute finding. Possible small optic drusen body noted at the left optic disc. Paranasal sinuses are clear. No mastoid effusion. Other: None. ASPECTS [facility] Stroke Program Early CT Score) - Ganglionic level infarction (caudate, lentiform nuclei, internal capsule, insula, M1-M3 cortex): 7 - Supraganglionic infarction (M4-M6 cortex): 3 Total score (0-10 with 10 being normal): 10 IMPRESSION: 1. No acute intracranial infarct or other abnormality. 2. ASPECTS is 10. 3. Mild age-related cerebral atrophy with chronic small vessel ischemic disease. These results were communicated to Dr. Rory Percy at [place] 7/26/2021by text page via the [facility] messaging system. Electronically Signed   By: Jeannine Boga M.D.   On: 09/23/2019 19:48       Subjective: Patient seen and examined at the bedside this morning.  Medically stable for discharge to home  Discharge Exam: Vitals:   09/24/19 1242 09/24/19 1647  BP: (!) 144/73 110/70  Pulse: 63 65  Resp: 17 18  Temp: 98 F (36.7 C) 97.6 F (36.4 C)  SpO2: 100% 100%   Vitals:   09/24/19 0603 09/24/19 0849 09/24/19 1242 09/24/19 1647  BP: 125/65 (!) 129/67 (!) 144/73 110/70  Pulse: 60 69 63 65  Resp: 19 17 17 18   Temp: 98.6 F (37 C) 98.1 F (36.7 C) 98 F (36.7 C) 97.6 F (36.4 C)  TempSrc: Oral Oral Oral Oral  SpO2: 97% 99% 100% 100%  Weight:      Height:        General: Pt is alert, awake, not in acute distress Cardiovascular: RRR, S1/S2 +, no rubs, no gallops Respiratory: CTA bilaterally, no wheezing, no rhonchi Abdominal: Soft, NT,  ND, bowel sounds + Extremities: no edema, no cyanosis    The results of significant diagnostics from this hospitalization (including imaging, microbiology, ancillary and laboratory) are listed below for reference.     Microbiology: Recent Results (from the past 240 hour(s))   SARS Coronavirus 2 by RT PCR (hospital order, performed in [facility] hospital lab) Nasopharyngeal Nasopharyngeal Swab     Status: None   Collection Time: 09/23/19  8:24 PM   Specimen: Nasopharyngeal Swab  Result Value Ref Range Status   SARS Coronavirus 2 NEGATIVE NEGATIVE Final    Comment: (NOTE) SARS-CoV-2 target nucleic acids are NOT DETECTED.  The SARS-CoV-2 RNA is generally detectable in upper and lower respiratory specimens during the acute phase of infection. The lowest concentration of SARS-CoV-2 viral copies this assay can detect is 250 copies / mL. A negative result does not preclude SARS-CoV-2 infection and should not be used as the sole basis for treatment or other patient management decisions.  A negative result may occur with improper specimen collection / handling, submission of specimen other than nasopharyngeal swab, presence of viral mutation(s) within the areas targeted by this assay, and inadequate number of viral copies (<250 copies / mL). A negative result must be combined with clinical observations, patient history, and epidemiological information.  Fact Sheet for Patients:   StrictlyIdeas.no  Fact Sheet for Healthcare Providers: BankingDealers.co.za  This test is not yet approved or  cleared by the [place] FDA and has been authorized for detection and/or diagnosis of SARS-CoV-2 by FDA under an Emergency Use Authorization (EUA).  This EUA will remain in effect (meaning this test can be used) for the duration of the COVID-19 declaration under Section 564(b)(1) of the Act, 21 U.S.C. section 360bbb-3(b)(1), unless the authorization is terminated or revoked sooner.  Performed at [facility], [place] [address] [place], [place] [address]      Labs: BNP (last 3 results) No results for input(s): BNP in the last 8760 hours. Basic Metabolic Panel: Recent Labs  Lab 09/23/19 1956 09/24/19 0243  NA  137  --   K 4.8  --   CL 107  --   CO2 22  --   GLUCOSE 100*  --   BUN 16  --   CREATININE 0.74 0.75  CALCIUM 7.8*  --    Liver Function Tests: Recent Labs  Lab 09/23/19 1956  AST 42*  ALT 22  ALKPHOS 52  BILITOT 0.7  PROT 5.4*  ALBUMIN 3.4*   No results for input(s): LIPASE, AMYLASE in the last 168 hours. No results for input(s): AMMONIA in the last 168 hours. CBC: Recent Labs  Lab 09/23/19 1956 09/24/19 0243  WBC 6.4 7.2  NEUTROABS 4.8  --   HGB 11.9* 12.0  HCT 36.5 36.2  MCV 97.1 94.8  PLT 185 233   Cardiac Enzymes: No results for input(s): CKTOTAL, CKMB, CKMBINDEX, TROPONINI in the last 168 hours. BNP: Invalid input(s): POCBNP CBG: Recent Labs  Lab 09/23/19 2241 09/24/19 0016 09/24/19 0612 09/24/19 1244 09/24/19 1651  GLUCAP 93 104* 105* 81 92   D-Dimer No results for input(s): DDIMER in the last 72 hours. Hgb A1c No results for input(s): HGBA1C in the last 72 hours. Lipid Profile No results for input(s): CHOL, HDL, LDLCALC, TRIG, CHOLHDL, LDLDIRECT in the last 72 hours. Thyroid function studies No results for input(s): TSH, T4TOTAL, T3FREE, THYROIDAB in the last 72 hours.  Invalid input(s): FREET3 Anemia work up No results for input(s): VITAMINB12, FOLATE, FERRITIN,  TIBC, IRON, RETICCTPCT in the last 72 hours. Urinalysis    Component Value Date/Time   COLORURINE YELLOW 09/23/2019 0135   APPEARANCEUR CLEAR 09/23/2019 0135   LABSPEC 1.012 09/23/2019 0135   PHURINE 8.0 09/23/2019 0135   GLUCOSEU NEGATIVE 09/23/2019 0135   GLUCOSEU NEGATIVE 08/22/2019 1141   HGBUR NEGATIVE 09/23/2019 0135   BILIRUBINUR NEGATIVE 09/23/2019 0135   KETONESUR NEGATIVE 09/23/2019 0135   PROTEINUR NEGATIVE 09/23/2019 0135   UROBILINOGEN 0.2 08/22/2019 1141   NITRITE NEGATIVE 09/23/2019 0135   LEUKOCYTESUR NEGATIVE 09/23/2019 0135   Sepsis Labs Invalid input(s): PROCALCITONIN,  WBC,  LACTICIDVEN Microbiology Recent Results (from the past 240 hour(s))  SARS  Coronavirus 2 by RT PCR (hospital order, performed in [place] hospital lab) Nasopharyngeal Nasopharyngeal Swab     Status: None   Collection Time: 09/23/19  8:24 PM   Specimen: Nasopharyngeal Swab  Result Value Ref Range Status   SARS Coronavirus 2 NEGATIVE NEGATIVE Final    Comment: (NOTE) SARS-CoV-2 target nucleic acids are NOT DETECTED.  The SARS-CoV-2 RNA is generally detectable in upper and lower respiratory specimens during the acute phase of infection. The lowest concentration of SARS-CoV-2 viral copies this assay can detect is 250 copies / mL. A negative result does not preclude SARS-CoV-2 infection and should not be used as the sole basis for treatment or other patient management decisions.  A negative result may occur with improper specimen collection / handling, submission of specimen other than nasopharyngeal swab, presence of viral mutation(s) within the areas targeted by this assay, and inadequate number of viral copies (<250 copies / mL). A negative result must be combined with clinical observations, patient history, and epidemiological information.  Fact Sheet for Patients:   StrictlyIdeas.no  Fact Sheet for Healthcare Providers: BankingDealers.co.za  This test is not yet approved or  cleared by the [place] FDA and has been authorized for detection and/or diagnosis of SARS-CoV-2 by FDA under an Emergency Use Authorization (EUA).  This EUA will remain in effect (meaning this test can be used) for the duration of the COVID-19 declaration under Section 564(b)(1) of the Act, 21 U.S.C. section 360bbb-3(b)(1), unless the authorization is terminated or revoked sooner.  Performed at [facility], [place] [address] [place], [place] [address]     Please note: You were cared for by a hospitalist during your hospital stay. Once you are discharged, your primary care physician will handle any further medical issues.  Please note that NO REFILLS for any discharge medications will be authorized once you are discharged, as it is imperative that you return to your primary care physician (or establish a relationship with a primary care physician if you do not have one) for your post hospital discharge needs so that they can reassess your need for medications and monitor your lab values.    Time coordinating discharge: 40 minutes  SIGNED:   Shelly Coss, MD  [facility] 09/24/2019, 7:06 PM Pager [phone]  If 7PM-7AM, please contact night-coverage [email or web address] Password [email or web address]

## 2019-09-24 NOTE — Telephone Encounter (Signed)
Spoke to husband. When he found her unresponsive, eyes were open and "she was not there." Mouth was open, both hands were like claws over her chest, laying on her side; no tongue bite or incontinence. MRI brain and EEG unremarkable. She has recovered nicely and is awake alert, better shape today than he has seen her in a long time. Motor activity like walking is fine. Main problem is getting her to swallow, can get her to eat jello, avocado pudding. Hallucinations are really bad, as well as dementia. 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 Has a hard time getting on to bed, says she forgot how. She insists all the time that she wants to drive the car, he is asking about POA, encouraged to have this document done. She has not been on Sinemet in hospital, with better cognition, potentially this was causing more confusion, would hold off on restarting for now then. Discussed long-term prognosis and care, 24/7 care advised. He wants her to stay at home. Continue with home ST, PT.

## 2019-09-24 NOTE — Telephone Encounter (Signed)
Spoke with husband, yesterday pt went to bed at 5pm he went to cook dinner at 6 pm he went back in the room with dinner pt had fallen over in bed he went to helo sit her up she was unresponsive he thought at 1st she "had passed" she was sill breathing he called 911 they thought she had had a stroke, at the hospital they did CT scan NO stroke seen on CT. Pt was unresponsive for 5 hours, pt husband asking if you can be involved in Kellie Moore care? Asking when she goes home does she need a 24 hour nurse to help with care? Also asking again what stage of parkinson's Kellie Moore is in?

## 2019-09-24 NOTE — Telephone Encounter (Signed)
Patient called in wanting to let Dr. Delice Lesch and Nira Conn know the patient went to the Emergency Room last night and he feels she will be in the hospital for a few days. He would like a call.

## 2019-10-07 ENCOUNTER — Other Ambulatory Visit: Payer: Self-pay

## 2019-10-07 ENCOUNTER — Ambulatory Visit: Payer: PPO | Attending: Internal Medicine

## 2019-10-07 DIAGNOSIS — R471 Dysarthria and anarthria: Secondary | ICD-10-CM

## 2019-10-07 DIAGNOSIS — R41841 Cognitive communication deficit: Secondary | ICD-10-CM | POA: Insufficient documentation

## 2019-10-07 DIAGNOSIS — R1312 Dysphagia, oropharyngeal phase: Secondary | ICD-10-CM | POA: Insufficient documentation

## 2019-10-07 NOTE — Patient Instructions (Signed)
   We will try some exercises that should improve your ability to talk clearly, and your ability to swallow.  To start, shout "HEY!" 10 times, twice a day - take a big breath before each one.

## 2019-10-07 NOTE — Therapy (Signed)
[facility] [address] [place], [place], [address] Phone: [phone]   Fax:  [phone]  Speech Language Pathology Evaluation  Patient Details  Name: Kellie Moore MRN: [id number] Date of Birth: Sep 06, 1950 Referring Provider (SLP): Ellouise Newer, MD   Encounter Date: 10/07/2019   End of Session - 10/07/19 1724    Visit Number 1    Number of Visits 17    Date for SLP Re-Evaluation 01/03/20    SLP Start Time 0936    SLP Stop Time  1016    SLP Time Calculation (min) 40 min    Activity Tolerance Patient tolerated treatment well           Past Medical History:  Diagnosis Date  . Cancer [facility])    Breast cancer    Past Surgical History:  Procedure Laterality Date  . BREAST LUMPECTOMY Right    radiation    There were no vitals filed for this visit.   Subjective Assessment - 10/07/19 0943    Subjective "By 1 pm it's very hard to understand her, and by lunchtime she has great difficulty swallowing her meals (lunch and dinner)."    Patient is accompained by: Family member   Husband Tom   Currently in Pain? No/denies              SLP Evaluation [facility] - 10/07/19 4270      SLP Visit Information   SLP Received On 10/07/19    Referring Provider (SLP) Ellouise Newer, MD    Onset Date approx 2015    Medical Diagnosis Parkinsons disease      Subjective   Patient/Family Stated Goal more effective communication, and eating (husband).      General Information   HPI Pt is a 69 yo female with PMH + for Parkinson's disease diagnosed approx 2-3 years ago with symptoms occuring 2-3 years before diagnosis.  Pt also with h/o anxiety, frequent falls in the last few months, breast cancer s/p lumpectomy/XRT, sialorrhea s/p atropine drop treatment, dysphagia.  Pt has lost weight to 132 down to 94 pounds over the last 3 1/2 to 4 years.  She has poor appetite (husband reports pt "enjoying sweets") but does not report food tasting  bad.  Per spouse, pt has more and more difficulty eating as the day progresses, and he cannot understand her after 1pm.  Pt and spouse deny pt having pneumonias, requiring heimlich manuever nor recent weight loss. Modified ID'd mild oropharyngeal dysphagia, c/b decr'd oral control of bolus with linguial pumping, premature spillage. Sequential swallows resulted in spillage of next bolus into pharynx with uncoordinated laryngeal closure resulting in penetration and aspiration. Chin tuck with SMALL SINGLE sips was helpful to protect airway. Recommendation for dys III/thin with slow rate, small bites/sips, effortful swallow, intermittent "hock"whole meds with puree. Pt with decrease in responsiveness 09-23-19 - admittted with encephalopathy. MRI showed no acute abnormalities and mild cerebral atrophy with chronic small vessel disease.      Balance Screen   Has the patient fallen in the past 6 months Yes   PT eval next week     Prior Functional Status   Cognitive/Linguistic Baseline Baseline deficits    Baseline deficit details dementia     Type of Home House     Lives With Spouse    Vocation Retired      Associate Professor   Overall Cognitive Status History of cognitive impairments - at baseline    Area of Impairment Orientation;Memory;Safety/judgement  Orientation Level Disoriented to;Time    General Comments Husband "She'll ask me 2-3 times a day what time it is."    Memory Decreased short-term memory;Decreased recall of precautions    Safety/Judgement Decreased awareness of safety;Decreased awareness of deficits    Safety and Judgement Comments pt with multiple falls- PT eval next week    Behaviors Other (comment)   pt silent for most of evaluation unless asked direct question. Heard humming x4 during eval, and stating to SLP and husband that humming calms her down.     Auditory Comprehension   Overall Auditory Comprehension Appears within functional limits for tasks assessed      Verbal Expression     Overall Verbal Expression Appears within functional limits for tasks assessed      Oral Motor/Sensory Function   Overall Oral Motor/Sensory Function Impaired    Labial ROM Within Functional Limits    Labial Symmetry Within Functional Limits    Labial Strength Reduced    Labial Coordination Reduced    Lingual ROM Within Functional Limits   with cues   Lingual Symmetry Within Functional Limits   mild tremor   Lingual Strength Reduced    Lingual Coordination WFL    Facial ROM Reduced right;Reduced left    Overall Oral Motor/Sensory Function Decr'd coordination of oral muscles in alternate motion tasks; better when SLP told pt to hit specific tactile targets (e.g., corners of her mouth with rapid lingual margin [employer] task).  Pt was able to follow oral commands with demo and verbal cues, so SLP suspects she may be able to perform dysarthria HEP/tasks for louder speech, adn possibly HEP for dysphagia. SLP ?s pt's ability for RMST but this HEP will be attempted as well.      Motor Speech   Overall Motor Speech Appears within functional limits for tasks assessed   "But this afternoon I won't understand a thing she says"   Respiration Impaired    Level of Impairment Word   shallow breaths, chest breathing   Phonation Low vocal intensity;Hoarse   low to mid 60s dB in responses to SLP questions, ocnsistently   Effective Techniques Increased vocal intensity   "hey" at low-mid 70s dB. No carryover of this incr'd loudness to spontaneous speech at any level today.   Phonation --                           SLP Education - 10/07/19 1722    Education Details loud "hey", we will attempt exercises to improve swallowing and talking -our success in therapy is dependent upon [place]'s ability to perform these exercises, pt will need assistance from husband for swallowing and speech compensations    Person(s) Educated Patient;Spouse    Methods Explanation;Demonstration;Verbal cues     Comprehension Verbalized understanding;Returned demonstration;Verbal cues required;Need further instruction            SLP Short Term Goals - 10/07/19 1732      SLP SHORT TERM GOAL #1   Title pt will complete dysphagia HEP with demo and verbal cues from husband in 2 sessions    Time 4    Period Weeks   or 9 visits, for all STGS   Status New      SLP SHORT TERM GOAL #2   Title pt will complete dysarthria HEP with demo and verbal cues from husband in 2 sessions    Time 4    Period Weeks  Status New      SLP SHORT TERM GOAL #3   Title pt husband will report 25% improvement in understanding pt at home in afternoons, between 2 sessions    Time 4    Period Weeks    Status New      SLP SHORT TERM GOAL #4   Title pt will demo loud "hey" average upper 70s dB in 3 sessions    Time 4    Period Weeks    Status New      SLP SHORT TERM GOAL #5   Title pt will answer average upper 60s dB in 15/20 sentence responses with rare min A for loudness    Time 4    Period Weeks    Status New            SLP Long Term Goals - 10/07/19 1737      SLP LONG TERM GOAL #1   Title pt will complete dysphagia HEP with consistent verbal cues from husband in 2 sessions    Time 8    Period Weeks   or 17 total sessions, for all LTGs   Status New      SLP LONG TERM GOAL #2   Title pt will complete dysarthria HEP with consistent verbal cues from husband in 2 sessions    Time 8    Period Weeks    Status New      SLP LONG TERM GOAL #3   Title husband will report 50% improvement in pt's speech intelligiblity at home in the afternoons in 3 sessions    Time 8    Period Weeks    Status New      SLP LONG TERM GOAL #4   Title pt will complete respiratory training exercises with 70% success with consistent demo and verbal cues in 2 sessions    Time 8    Period Weeks    Status New            Plan - 10/07/19 1725    Clinical Impression Statement Pt presents today with reported diffiuclty with  speech intelligibility per husband, starting in the early afternoon, and with a mild oropharyngeal dysphagia c/b decr'd oral control of liquid boluses with dec'r dtiming of laryngeal closure resulting in penetration and mild aspiration.Dys III/thin with chin tuck with small single sips better-protected pt airway. See "HPI" for other recommendations/precautions for POs. RMST was suggested to improve pulmonary clearance of any aspirates. Pt was able to follow simple oral commands with demo cues adn verbal explanation but given her diagnosis of dementia, pt may or may not be able to follow more complex oral maneuvers such as swallowing HEP, abdominal breathing, or loudness tasks.    Speech Therapy Frequency 2x / week    Duration --   8 weeks or 17 total sessions   Treatment/Interventions Aspiration precaution training;Pharyngeal strengthening exercises;Compensatory techniques;Diet toleration management by SLP;Trials of upgraded texture/liquids;Cueing hierarchy;Cognitive reorganization;Patient/family education;SLP instruction and feedback;Functional tasks;Environmental controls    Potential to Achieve Goals Fair    Potential Considerations Ability to learn/carryover information;Co-morbidities;Severity of impairments    SLP Home Exercise Plan "HEY" provided today    Consulted and Agree with Plan of Care Patient;Family member/caregiver    Family Member Consulted husband           Patient will benefit from skilled therapeutic intervention in order to improve the following deficits and impairments:   Dysarthria and anarthria  Dysphagia, oropharyngeal phase  Cognitive communication deficit  Problem List Patient Active Problem List   Diagnosis Date Noted  . Acute encephalopathy 09/23/2019  . Parkinson's disease ([place]) 09/23/2019  . Dementia ([place]) 09/23/2019    [facility] ,[place], [place]  10/07/2019, 5:41 PM  [facility] [address]  [place], [place], [address] Phone: [phone]   Fax:  [phone]  Name: Kellie Moore MRN: [id number] Date of Birth: Sep 10, 1950

## 2019-10-08 DIAGNOSIS — E43 Unspecified severe protein-calorie malnutrition: Secondary | ICD-10-CM | POA: Diagnosis not present

## 2019-10-08 DIAGNOSIS — G9340 Encephalopathy, unspecified: Secondary | ICD-10-CM | POA: Diagnosis not present

## 2019-10-08 DIAGNOSIS — G20 Parkinson's disease: Secondary | ICD-10-CM | POA: Diagnosis not present

## 2019-10-10 ENCOUNTER — Ambulatory Visit: Payer: PPO | Admitting: Speech Pathology

## 2019-10-10 ENCOUNTER — Telehealth: Payer: Self-pay | Admitting: Neurology

## 2019-10-10 ENCOUNTER — Telehealth: Payer: Self-pay | Admitting: Speech Pathology

## 2019-10-10 ENCOUNTER — Other Ambulatory Visit: Payer: Self-pay

## 2019-10-10 DIAGNOSIS — R1310 Dysphagia, unspecified: Secondary | ICD-10-CM

## 2019-10-10 DIAGNOSIS — R41841 Cognitive communication deficit: Secondary | ICD-10-CM

## 2019-10-10 DIAGNOSIS — G20 Parkinson's disease: Secondary | ICD-10-CM

## 2019-10-10 DIAGNOSIS — R471 Dysarthria and anarthria: Secondary | ICD-10-CM | POA: Diagnosis not present

## 2019-10-10 DIAGNOSIS — R1312 Dysphagia, oropharyngeal phase: Secondary | ICD-10-CM

## 2019-10-10 NOTE — Patient Instructions (Signed)
Swallow Precautions  When you drink liquids: Small sips (think "hot coffee"), one sip at a time. Tuck your chin when you swallow. Do not take multiple sips.  When you eat solids: chew thoroughly, make sure your mouth is totally clear and that you have swallowed before you take another bite.  -If "sundowning" and she is not chewing or manipulating solid foods, try only puree or liquids. Make sure she is actually swallowing them.  -Schedule mealtimes for when she is most alert and responsive. Do not eat when you are lethargic or not responsive.  Pills: Take your pills whole in applesauce or pudding.   Signs of Aspiration Pneumonia   . Chest pain/tightness . Fever (can be low grade) . Cough  o With foul-smelling phlegm (sputum) o With sputum containing pus or blood o With greenish sputum . Fatigue  . Shortness of breath  . Wheezing   **IF YOU HAVE THESE SIGNS, CONTACT YOUR DOCTOR OR GO TO THE EMERGENCY DEPARTMENT OR URGENT CARE AS SOON AS POSSIBLE**

## 2019-10-10 NOTE — Telephone Encounter (Signed)
Left VM requesting call back

## 2019-10-10 NOTE — Therapy (Signed)
[facility] [address] [place], [place], [address] Phone: [phone]   Fax:  [phone]  Speech Language Pathology Treatment  Patient Details  Name: Kellie Moore MRN: [id number] Date of Birth: Mar 11, 1950 Referring Provider (SLP): Ellouise Newer, MD   Encounter Date: 10/10/2019   End of Session - 10/10/19 1134    Visit Number 2    Number of Visits 17    Date for SLP Re-Evaluation 01/03/20    SLP Start Time 6378    SLP Stop Time  1105    SLP Time Calculation (min) 50 min    Activity Tolerance Patient tolerated treatment well           Past Medical History:  Diagnosis Date  . Cancer [facility])    Breast cancer    Past Surgical History:  Procedure Laterality Date  . BREAST LUMPECTOMY Right    radiation    There were no vitals filed for this visit.   Subjective Assessment - 10/10/19 1129    Subjective "I made a big mistake. I should have done in-home care." Pt's husband requesting how pt can receive [place] services.    Patient is accompained by: Family member   husband Gershon Mussel   Currently in Pain? No/denies                 ADULT SLP TREATMENT - 10/10/19 1130      General Information   Behavior/Cognition Alert;Cooperative;Distractible      Treatment Provided   Treatment provided Dysphagia      Dysphagia Treatment   Temperature Spikes Noted No    Respiratory Status Room air    Patient observed directly with PO's Yes    Type of PO's observed Dysphagia 3 (soft);Dysphagia 1 (puree);Thin liquids    Feeding Able to feed self   however sundowning reported making evening meal difficult   Liquids provided via Cup    Oral Phase Signs & Symptoms Prolonged mastication;Prolonged bolus formation;Oral holding    Pharyngeal Phase Signs & Symptoms Suspected delayed swallow initiation    Type of cueing Verbal    Amount of cueing Moderate    Other treatment/comments Reviewed swallowing precautions (chin tuck, dys  3/thin, pills in applesauce, small sips/bites) and signs of aspiration PNA with pt and husband. Extensive teaching regarding rationale for precautions; handout also provided. Husband showed SLP video of pt with evening meal; virtually absent efforts at mastication, bolus formation, and oral transfer. SLP educated may need to downgrade to puree and liquids at night. Had husband observe for swallow initiation and explained not to feed pt if she is not responsive or initiating a pharyngeal swallow. (see pt instructions). Husband did not recall recommendation for meds in puree so reinforced this today. Pt d/c at their request in lieu of home health therapies.       Pain Assessment   Pain Assessment No/denies pain      Assessment / Recommendations / Plan   Plan Discharge SLP treatment due to (comment)   Home health due to taxing effort to leave the home per spous     Dysphagia Recommendations   Diet recommendations Dysphagia 3 (mechanical soft);Dysphagia 1 (puree);Thin liquid    Liquids provided via Cup    Medication Administration Whole meds with puree    Supervision Full supervision/cueing for compensatory strategies    Compensations Small sips/bites;Slow rate;Check for pocketing;Clear throat intermittently    Postural Changes and/or Swallow Maneuvers Chin tuck   with liquids  General Recommendations   General recommendations --   home health referral   Oral Care Recommendations Oral care before and after PO      Progression Toward Goals   Progression toward goals Not progressing toward goals (comment)   insufficient time to address; pt d/c today requesting [facility]           SLP Education - 10/10/19 1133    Education Details swallow precautions, signs of aspiration PNA, may need to downgrade texture in evening (puree, liquids only)    Person(s) Educated Patient;Spouse    Methods Explanation;Verbal cues;Handout    Comprehension Verbalized understanding;Need further instruction;Other  (comment)   need further instruction through [place] - 10/10/19 [place] #1   Title pt will complete dysphagia HEP with demo and verbal cues from husband in 2 sessions    Time 4    Period Weeks   or 9 visits, for all STGS   Status Not Met      SLP SHORT TERM GOAL #2   Title pt will complete dysarthria HEP with demo and verbal cues from husband in 2 sessions    Time 4    Period Weeks    Status Not Met      SLP SHORT TERM GOAL #3   Title pt husband will report 25% improvement in understanding pt at home in afternoons, between 2 sessions    Time 4    Period Weeks    Status Not Met      SLP SHORT TERM GOAL #4   Title pt will demo loud "hey" average upper 70s dB in 3 sessions    Time 4    Period Weeks    Status Not Met      SLP SHORT TERM GOAL #5   Title pt will answer average upper 60s dB in 15/20 sentence responses with rare min A for loudness    Time 4    Period Weeks    Status Not Met            SLP Long Term Goals - 10/10/19 1138      SLP LONG TERM GOAL #1   Title pt will complete dysphagia HEP with consistent verbal cues from husband in 2 sessions    Time 8    Period Weeks   or 17 total sessions, for all LTGs   Status Not Met      SLP LONG TERM GOAL #2   Title pt will complete dysarthria HEP with consistent verbal cues from husband in 2 sessions    Time 8    Period Weeks    Status Not Met      SLP LONG TERM GOAL #3   Title husband will report 50% improvement in pt's speech intelligiblity at home in the afternoons in 3 sessions    Time 8    Period Weeks    Status Not Met      SLP LONG TERM GOAL #4   Title pt will complete respiratory training exercises with 70% success with consistent demo and verbal cues in 2 sessions    Time 8    Period Weeks    Status Not Met            Plan - 10/10/19 1134    Clinical Impression Statement Patient presents with hypokinetic dysarthria and oropharyngeal  dysphagia. Husband reports sundowning  in afternoon/evening with worsening speech and swallowing difficulties. She is d/c today as husband requesting referral for home health therapies as he has difficulty getting her out of the house. SLP focused session on education re: aspiration precautions, swallow strategies, diet texture modifications and signs of aspiration PNA. Pt will need caregiver assist with these due to congitive deficits. Handout provided and questions answered to their satisfaction. Contacted Dr. Amparo Bristol office to request [facility] referral.    Speech Therapy Frequency --   d/c   Duration --   d/c   Treatment/Interventions Aspiration precaution training;Pharyngeal strengthening exercises;Compensatory techniques;Diet toleration management by SLP;Trials of upgraded texture/liquids;Cueing hierarchy;Cognitive reorganization;Patient/family education;SLP instruction and feedback;Functional tasks;Environmental controls    Potential to Achieve Goals Fair    Potential Considerations Ability to learn/carryover information;Co-morbidities;Severity of impairments    SLP Home Exercise Plan "HEY" provided today    Consulted and Agree with Plan of Care Patient;Family member/caregiver    Family Member Consulted husband           Patient will benefit from skilled therapeutic intervention in order to improve the following deficits and impairments:   Dysphagia, oropharyngeal phase  Cognitive communication deficit  Dysarthria and anarthria    Problem List Patient Active Problem List   Diagnosis Date Noted  . Acute encephalopathy 09/23/2019  . Parkinson's disease ([place]) 09/23/2019  . Dementia ([place]) 09/23/2019   SPEECH THERAPY DISCHARGE SUMMARY  Visits from Start of Care: 2  Current functional level related to goals / functional outcomes: Goals not met; insufficient time to address. D/c today at pt request for home health. See clinical impressions for current functional status.   Remaining  deficits: Hypokinetic dysarthria, oropharyngeal dysphagia and cognitive deficits persist.   Education / Equipment: Swallowing precautions, diet texture modification, signs of aspiration PNA Plan: Patient agrees to discharge.  Patient goals were not met. Patient is being discharged due to the patient's request.  ?????         Deneise Lever, [place], CCC-SLP Speech-Language Pathologist  Aliene Altes 10/10/2019, 11:43 AM  [facility] [address] [place], [place], [address] Phone: [phone]   Fax:  [phone]   Name: CAREN GARSKE MRN: [id number] Date of Birth: January 27, 1951

## 2019-10-10 NOTE — Telephone Encounter (Signed)
Home Health PT/OT/ST info faxed to [facility] for review [phone]

## 2019-10-10 NOTE — Telephone Encounter (Signed)
Placed call to Dr. Amparo Bristol office as pt's husband requesting home health therapies vs OP due to taxing effort to leave the home. Have requested Dr. Delice Lesch place orders for [place], OT and ST. Pt will be discharged from OP therapy today at her request and remaining visits cancelled.   Deneise Lever, MS, Actor

## 2019-10-10 NOTE — Telephone Encounter (Signed)
Kellie Moore from [facility] called in and they need a referral for Speech, occupational, and home physical therapy.

## 2019-10-10 NOTE — Telephone Encounter (Signed)
ok 

## 2019-10-16 DIAGNOSIS — F419 Anxiety disorder, unspecified: Secondary | ICD-10-CM

## 2019-10-18 ENCOUNTER — Ambulatory Visit: Payer: PPO | Admitting: Physical Therapy

## 2019-10-21 ENCOUNTER — Ambulatory Visit: Payer: PPO | Admitting: Physical Therapy

## 2019-10-22 ENCOUNTER — Ambulatory Visit: Payer: PPO

## 2019-10-24 ENCOUNTER — Encounter: Payer: PPO | Admitting: Occupational Therapy

## 2019-10-25 DIAGNOSIS — R251 Tremor, unspecified: Secondary | ICD-10-CM

## 2019-10-25 DIAGNOSIS — G20 Parkinson's disease: Secondary | ICD-10-CM

## 2019-10-25 DIAGNOSIS — R4182 Altered mental status, unspecified: Secondary | ICD-10-CM

## 2019-10-25 NOTE — Telephone Encounter (Signed)
-----   Message from Cameron Sprang, MD sent at 10/17/2019 12:43 PM EDT ----- Regarding: pls f/u Husband asking about in-home PT and OT, looks like Nevin Bloodgood had faxed order last 8/12 to [facility], can you pls check and see why they have not heard back yet?Thanks!

## 2019-10-25 NOTE — Telephone Encounter (Signed)
-----   Message from Cameron Sprang, MD sent at 10/17/2019 12:43 PM EDT ----- Regarding: pls f/u Husband asking about in-home PT and OT, looks like Nevin Bloodgood had faxed order last 8/12 to [facility], can you pls check and see why they have not heard back yet?Thanks!

## 2019-11-01 DIAGNOSIS — F0280 Dementia in other diseases classified elsewhere without behavioral disturbance: Secondary | ICD-10-CM | POA: Diagnosis not present

## 2019-11-01 DIAGNOSIS — Z9181 History of falling: Secondary | ICD-10-CM | POA: Diagnosis not present

## 2019-11-01 DIAGNOSIS — R1312 Dysphagia, oropharyngeal phase: Secondary | ICD-10-CM | POA: Diagnosis not present

## 2019-11-01 DIAGNOSIS — D649 Anemia, unspecified: Secondary | ICD-10-CM | POA: Diagnosis not present

## 2019-11-01 DIAGNOSIS — F329 Major depressive disorder, single episode, unspecified: Secondary | ICD-10-CM | POA: Diagnosis not present

## 2019-11-01 DIAGNOSIS — F419 Anxiety disorder, unspecified: Secondary | ICD-10-CM | POA: Diagnosis not present

## 2019-11-01 DIAGNOSIS — Z853 Personal history of malignant neoplasm of breast: Secondary | ICD-10-CM | POA: Diagnosis not present

## 2019-11-01 DIAGNOSIS — Z79899 Other long term (current) drug therapy: Secondary | ICD-10-CM | POA: Diagnosis not present

## 2019-11-01 DIAGNOSIS — Z8673 Personal history of transient ischemic attack (TIA), and cerebral infarction without residual deficits: Secondary | ICD-10-CM | POA: Diagnosis not present

## 2019-11-01 DIAGNOSIS — G20 Parkinson's disease: Secondary | ICD-10-CM | POA: Diagnosis not present

## 2019-11-06 ENCOUNTER — Other Ambulatory Visit: Payer: Self-pay

## 2019-11-06 ENCOUNTER — Telehealth: Payer: Self-pay | Admitting: Neurology

## 2019-11-06 DIAGNOSIS — F419 Anxiety disorder, unspecified: Secondary | ICD-10-CM | POA: Diagnosis not present

## 2019-11-06 DIAGNOSIS — F329 Major depressive disorder, single episode, unspecified: Secondary | ICD-10-CM | POA: Diagnosis not present

## 2019-11-06 DIAGNOSIS — F0280 Dementia in other diseases classified elsewhere without behavioral disturbance: Secondary | ICD-10-CM | POA: Diagnosis not present

## 2019-11-06 DIAGNOSIS — Z853 Personal history of malignant neoplasm of breast: Secondary | ICD-10-CM | POA: Diagnosis not present

## 2019-11-06 DIAGNOSIS — D649 Anemia, unspecified: Secondary | ICD-10-CM | POA: Diagnosis not present

## 2019-11-06 DIAGNOSIS — Z8673 Personal history of transient ischemic attack (TIA), and cerebral infarction without residual deficits: Secondary | ICD-10-CM | POA: Diagnosis not present

## 2019-11-06 DIAGNOSIS — G20 Parkinson's disease: Secondary | ICD-10-CM

## 2019-11-06 DIAGNOSIS — Z9181 History of falling: Secondary | ICD-10-CM | POA: Diagnosis not present

## 2019-11-06 DIAGNOSIS — R4182 Altered mental status, unspecified: Secondary | ICD-10-CM

## 2019-11-06 DIAGNOSIS — G20C Parkinsonism, unspecified: Secondary | ICD-10-CM

## 2019-11-06 DIAGNOSIS — R1312 Dysphagia, oropharyngeal phase: Secondary | ICD-10-CM | POA: Diagnosis not present

## 2019-11-06 DIAGNOSIS — R1310 Dysphagia, unspecified: Secondary | ICD-10-CM

## 2019-11-06 DIAGNOSIS — Z79899 Other long term (current) drug therapy: Secondary | ICD-10-CM | POA: Diagnosis not present

## 2019-11-06 DIAGNOSIS — G2581 Restless legs syndrome: Secondary | ICD-10-CM

## 2019-11-06 NOTE — Telephone Encounter (Signed)
Would do one medication change at a time. Carbidopa is actually used at bedtime for restless leg, but stop for now. If she has not had her iron levels checked, this needs to be done when patients have worsened restless leg. Pls order Fe+TIBC+Fer if she has not had it done with her PCP. thanks

## 2019-11-06 NOTE — Telephone Encounter (Signed)
Spoke with pt husband they are going to try and stop the Carbidopa and see how she does, pt husband also asking if there is anything she can take for restless leg?

## 2019-11-06 NOTE — Telephone Encounter (Signed)
Patient's husband called in. He feels like she is over medicated. He believes her carbidopa-levodopa may be making her symptoms worse. She is fine in the mornings, but towards the end of the day her symptoms progressively get worse.

## 2019-11-06 NOTE — Telephone Encounter (Signed)
He can try stopping the carbidopa and see if there is improvement. But the worsening later in the day is more likely part of her condition, sundowning. Thanks

## 2019-11-07 NOTE — Telephone Encounter (Signed)
Spoke to pt husband informed him that we Would do one medication change at a time. Carbidopa is actually used at bedtime for restless leg, but stop for now. If she has not had her iron levels checked, this needs to be done when patients have worsened restless leg. They are going to come Monday to have lab work done orders placed in epic

## 2019-11-07 NOTE — Addendum Note (Signed)
Addended by: Jake Seats on: 11/07/2019 01:35 PM   Modules accepted: Orders

## 2019-11-08 DIAGNOSIS — R269 Unspecified abnormalities of gait and mobility: Secondary | ICD-10-CM | POA: Diagnosis not present

## 2019-11-08 DIAGNOSIS — R6251 Failure to thrive (child): Secondary | ICD-10-CM | POA: Diagnosis not present

## 2019-11-08 DIAGNOSIS — G20 Parkinson's disease: Secondary | ICD-10-CM | POA: Diagnosis not present

## 2019-11-11 ENCOUNTER — Inpatient Hospital Stay (HOSPITAL_COMMUNITY)
Admission: EM | Admit: 2019-11-11 | Discharge: 2019-11-13 | DRG: 092 | Disposition: A | Discharge status: Home / Self Care | Payer: PPO | Attending: Internal Medicine | Admitting: Internal Medicine

## 2019-11-11 ENCOUNTER — Ambulatory Visit: Payer: PPO | Admitting: Physical Therapy

## 2019-11-11 ENCOUNTER — Other Ambulatory Visit: Payer: Self-pay

## 2019-11-11 ENCOUNTER — Emergency Department (HOSPITAL_COMMUNITY): Payer: PPO

## 2019-11-11 ENCOUNTER — Encounter (HOSPITAL_COMMUNITY): Payer: Self-pay

## 2019-11-11 ENCOUNTER — Telehealth: Payer: Self-pay

## 2019-11-11 DIAGNOSIS — G9340 Encephalopathy, unspecified: Secondary | ICD-10-CM | POA: Diagnosis not present

## 2019-11-11 DIAGNOSIS — Z853 Personal history of malignant neoplasm of breast: Secondary | ICD-10-CM | POA: Diagnosis not present

## 2019-11-11 DIAGNOSIS — R4182 Altered mental status, unspecified: Secondary | ICD-10-CM | POA: Diagnosis not present

## 2019-11-11 DIAGNOSIS — G20 Parkinson's disease: Secondary | ICD-10-CM | POA: Diagnosis present

## 2019-11-11 DIAGNOSIS — R05 Cough: Secondary | ICD-10-CM | POA: Diagnosis present

## 2019-11-11 DIAGNOSIS — Z20822 Contact with and (suspected) exposure to covid-19: Secondary | ICD-10-CM | POA: Diagnosis present

## 2019-11-11 DIAGNOSIS — Z79899 Other long term (current) drug therapy: Secondary | ICD-10-CM

## 2019-11-11 DIAGNOSIS — R471 Dysarthria and anarthria: Secondary | ICD-10-CM | POA: Diagnosis present

## 2019-11-11 DIAGNOSIS — F0281 Dementia in other diseases classified elsewhere with behavioral disturbance: Secondary | ICD-10-CM | POA: Diagnosis present

## 2019-11-11 DIAGNOSIS — R4020 Unspecified coma: Secondary | ICD-10-CM | POA: Diagnosis not present

## 2019-11-11 DIAGNOSIS — R410 Disorientation, unspecified: Secondary | ICD-10-CM | POA: Diagnosis not present

## 2019-11-11 DIAGNOSIS — J90 Pleural effusion, not elsewhere classified: Secondary | ICD-10-CM | POA: Diagnosis not present

## 2019-11-11 DIAGNOSIS — R5383 Other fatigue: Secondary | ICD-10-CM

## 2019-11-11 DIAGNOSIS — E860 Dehydration: Secondary | ICD-10-CM | POA: Diagnosis present

## 2019-11-11 DIAGNOSIS — R0902 Hypoxemia: Secondary | ICD-10-CM | POA: Diagnosis not present

## 2019-11-11 DIAGNOSIS — R9431 Abnormal electrocardiogram [ECG] [EKG]: Secondary | ICD-10-CM | POA: Diagnosis not present

## 2019-11-11 DIAGNOSIS — R569 Unspecified convulsions: Secondary | ICD-10-CM | POA: Diagnosis not present

## 2019-11-11 DIAGNOSIS — F0391 Unspecified dementia with behavioral disturbance: Secondary | ICD-10-CM | POA: Diagnosis not present

## 2019-11-11 DIAGNOSIS — R404 Transient alteration of awareness: Secondary | ICD-10-CM | POA: Diagnosis not present

## 2019-11-11 DIAGNOSIS — Z66 Do not resuscitate: Secondary | ICD-10-CM | POA: Diagnosis present

## 2019-11-11 DIAGNOSIS — I10 Essential (primary) hypertension: Secondary | ICD-10-CM | POA: Diagnosis not present

## 2019-11-11 DIAGNOSIS — G20A1 Parkinson's disease without dyskinesia, without mention of fluctuations: Secondary | ICD-10-CM | POA: Diagnosis present

## 2019-11-11 DIAGNOSIS — F0390 Unspecified dementia without behavioral disturbance: Secondary | ICD-10-CM | POA: Diagnosis present

## 2019-11-11 DIAGNOSIS — G92 Toxic encephalopathy: Secondary | ICD-10-CM | POA: Diagnosis present

## 2019-11-11 HISTORY — DX: Parkinson's disease: G20

## 2019-11-11 HISTORY — DX: Dementia in other diseases classified elsewhere, unspecified severity, without behavioral disturbance, psychotic disturbance, mood disturbance, and anxiety: F02.80

## 2019-11-11 LAB — COMPREHENSIVE METABOLIC PANEL
ALT: 11 U/L (ref 0–44)
AST: 26 U/L (ref 15–41)
Albumin: 4.2 g/dL (ref 3.5–5.0)
Alkaline Phosphatase: 69 U/L (ref 38–126)
Anion gap: 10 (ref 5–15)
BUN: 14 mg/dL (ref 8–23)
CO2: 27 mmol/L (ref 22–32)
Calcium: 9.8 mg/dL (ref 8.9–10.3)
Chloride: 101 mmol/L (ref 98–111)
Creatinine, Ser: 0.82 mg/dL (ref 0.44–1.00)
GFR calc Af Amer: >60 mL/min (ref >60)
GFR calc non Af Amer: >60 mL/min (ref >60)
Glucose, Bld: 96 mg/dL (ref 70–99)
Potassium: 4.3 mmol/L (ref 3.5–5.1)
Sodium: 138 mmol/L (ref 135–145)
Total Bilirubin: 0.7 mg/dL (ref 0.3–1.2)
Total Protein: 7 g/dL (ref 6.5–8.1)

## 2019-11-11 LAB — CBC WITH DIFFERENTIAL/PLATELET
Abs Immature Granulocytes: 0.01 10*3/uL (ref 0.00–0.07)
Basophils Absolute: 0 10*3/uL (ref 0.0–0.1)
Basophils Relative: 1 %
Eosinophils Absolute: 0.1 10*3/uL (ref 0.0–0.5)
Eosinophils Relative: 1 %
HCT: 42.1 % (ref 36.0–46.0)
Hemoglobin: 13.6 g/dL (ref 12.0–15.0)
Immature Granulocytes: 0 %
Lymphocytes Relative: 26 %
Lymphs Abs: 1.6 10*3/uL (ref 0.7–4.0)
MCH: 31.1 pg (ref 26.0–34.0)
MCHC: 32.3 g/dL (ref 30.0–36.0)
MCV: 96.3 fL (ref 80.0–100.0)
Monocytes Absolute: 0.4 10*3/uL (ref 0.1–1.0)
Monocytes Relative: 7 %
Neutro Abs: 4.1 10*3/uL (ref 1.7–7.7)
Neutrophils Relative %: 65 %
Platelets: 221 10*3/uL (ref 150–400)
RBC: 4.37 MIL/uL (ref 3.87–5.11)
RDW: 12.7 % (ref 11.5–15.5)
WBC: 6.3 10*3/uL (ref 4.0–10.5)
nRBC: 0 % (ref 0.0–0.2)

## 2019-11-11 LAB — TROPONIN I (HIGH SENSITIVITY)
Troponin I (High Sensitivity): 5 ng/L (ref <18)
Troponin I (High Sensitivity): 6 ng/L (ref <18)

## 2019-11-11 LAB — URINALYSIS, ROUTINE W REFLEX MICROSCOPIC
Bilirubin Urine: NEGATIVE
Glucose, UA: NEGATIVE mg/dL
Hgb urine dipstick: NEGATIVE
Ketones, ur: NEGATIVE mg/dL
Leukocytes,Ua: NEGATIVE
Nitrite: NEGATIVE
Protein, ur: NEGATIVE mg/dL
Specific Gravity, Urine: 1.009 (ref 1.005–1.030)
pH: 7 (ref 5.0–8.0)

## 2019-11-11 LAB — SARS CORONAVIRUS 2 BY RT PCR (HOSPITAL ORDER, PERFORMED IN ~~LOC~~ HOSPITAL LAB): SARS Coronavirus 2: NEGATIVE

## 2019-11-11 LAB — CBG MONITORING, ED: Glucose-Capillary: 97 mg/dL (ref 70–99)

## 2019-11-11 MED ORDER — LACTATED RINGERS IV BOLUS
1000.0000 mL | Freq: Once | INTRAVENOUS | Status: AC
  Administered 2019-11-11: 1000 mL via INTRAVENOUS

## 2019-11-11 NOTE — Telephone Encounter (Signed)
Spoke to pt husband he stated that pt was breathing but she was unresponsive, pt husband advised to hang up and call 911. He verbalized understanding stated that he was calling 911, Dr Delice Lesch informed of what is going on.

## 2019-11-11 NOTE — ED Triage Notes (Signed)
Pt to hall 19 from EMS. Pt from home, husband reports that pt has been in a catatonic state x2hrs. Pt is alert, can follow commands but is not speaking. Pt's baseline is up ad lib, independent living. Pt is able to move all extremities somewhat. Pt unable to answer questions on arrival.

## 2019-11-11 NOTE — ED Notes (Signed)
Pt returned from CT scan.

## 2019-11-11 NOTE — ED Provider Notes (Addendum)
[place] EMERGENCY DEPARTMENT Provider Note   CSN: [id number] Arrival date & time: 11/11/19  1601     History Chief Complaint  Patient presents with  . Altered Mental Status    ELSEY HOLTS is a 69 y.o. female.  Patient is a 69 year old female with a history Parkinson's disease, dementia and prior acute encephalopathy who presents today by EMS due to decreased mental status.  Patient's husband provides the history.  He reports when she woke up this morning she seemed to be her normal self.  She normally is able to eat some and does need some assistance but can get out of bed on her own.  After having breakfast she went back to sleep.  He let her sleep 2 more hours and then got her up for the morning.  She had some lunch and then around 3:00 became very lethargic.  He reports that she would look around briefly but was not speaking and was not able to get up on her own.  She has chronic cough from known aspiration but it is been unchanged.  She has not had a bowel movement now in 3 days and he did give her a laxative yesterday but she is still not had a bowel movement.  He reports she is a little more interactive now than she was at home but she had a very similar episode in July of this year.  He is concerned that she is dehydrated because she does not eat or drink much.  Also they have recently been adjusting her levodopa carbidopa.  He reports on Friday she did not take it at all but then by Saturday evening she was becoming very rigid and he gave her a dose and gave her 3 doses yesterday and 2 doses today.  There is been no other medication changes.  She has had no nausea or vomiting or fever.  The history is provided by the spouse. The history is limited by the condition of the patient.  Altered Mental Status Presenting symptoms: behavior changes        Past Medical History:  Diagnosis Date  . Cancer [facility])    Breast cancer    Patient Active Problem List    Diagnosis Date Noted  . Acute encephalopathy 09/23/2019  . Parkinson's disease ([place]) 09/23/2019  . Dementia ([place]) 09/23/2019    Past Surgical History:  Procedure Laterality Date  . BREAST LUMPECTOMY Right    radiation     OB History   No obstetric history on file.     Family History  Problem Relation Age of Onset  . Brain cancer Father     Social History   Tobacco Use  . Smoking status: Never Smoker  . Smokeless tobacco: Never Used  Substance Use Topics  . Alcohol use: Yes    Comment: margarita's on fridays  . Drug use: Never    Home Medications Prior to Admission medications   Medication Sig Start Date End Date Taking? Authorizing Provider  atropine 1 % ophthalmic solution For drooling,these drops can be diluted 48ml in 146ml of water and used as a mouth rinse up to three times a day. You can also try 2 drops under the tongue up to 3 times a day without diluting it if that works better for you Patient taking differently: Place 1-2 drops under the tongue See admin instructions. "For drooling, these drops can be diluted 1 ml in 100 ml's of water and used as a mouth  rinse up to three times a day. You can also try 2 drops under the tongue up to 3 times a day without diluting it if that works better for you." 06/27/19   Cameron Sprang, MD  bisacodyl (BISACODYL) 5 MG EC tablet Take 5 mg by mouth at bedtime as needed for mild constipation or moderate constipation.    [provider]  calcium carbonate (TUMS EX) 750 MG chewable tablet Chew 1 tablet by mouth as needed for heartburn (or reflux).    [provider]  carbidopa-levodopa (SINEMET IR) 25-100 MG tablet Take 1 tablet three times a day with meals Patient taking differently: Take 1 tablet by mouth See admin instructions. Take 1 tablet by mouth three times a day with meals- 9:30 AM, 1 PM, and 6 PM 08/28/19   Cameron Sprang, MD  donepezil (ARICEPT) 10 MG tablet Take 1/2 tablet daily for 2 weeks, then increase  to 1 tablet daily Patient taking differently: Take 10 mg by mouth in the morning.  08/28/19   Cameron Sprang, MD  mirtazapine (REMERON) 15 MG tablet TAKE 2 TABLETS BY MOUTH AT BEDTIME Patient taking differently: Take 30 mg by mouth at bedtime.  06/27/19   Cameron Sprang, MD  Multiple Vitamins-Minerals (ONE-A-DAY WOMENS 50 PLUS) TABS Take 1 tablet by mouth daily with breakfast.    [provider]  oxymetazoline (AFRIN) 0.05 % nasal spray Place 1 spray into both nostrils 2 (two) times daily as needed for congestion.    [provider]  oxymetazoline (VICKS SINEX SEVERE DECONGEST) 0.05 % nasal spray Place 1 spray into both nostrils 2 (two) times daily as needed for congestion.    [provider]    Allergies    Other  Review of Systems   Review of Systems  All other systems reviewed and are negative.   Physical Exam Updated Vital Signs BP (!) 165/105 (BP Location: Right Arm)   Pulse 96   Resp 16   SpO2 100%   Physical Exam Vitals and nursing note reviewed.  Constitutional:      Appearance: She is well-developed and normal weight. She is ill-appearing.     Comments: Patient is lethargic but is looking around and will follow some commands.  HENT:     Head: Normocephalic and atraumatic.     Mouth/Throat:     Mouth: Mucous membranes are dry.     Comments: Breathing with mouth open with slight snoring and mouth and tongue are very dry Eyes:     Pupils: Pupils are equal, round, and reactive to light.     Comments: Pupils are 4 mm bilaterally and briskly reactive  Cardiovascular:     Rate and Rhythm: Normal rate and regular rhythm.     Heart sounds: Normal heart sounds. No murmur heard.  No friction rub.  Pulmonary:     Effort: Pulmonary effort is normal.     Breath sounds: Normal breath sounds. No wheezing or rales.  Abdominal:     General: Bowel sounds are normal. There is no distension.     Palpations: Abdomen is soft.     Tenderness: There is no  abdominal tenderness. There is no guarding or rebound.  Musculoskeletal:        General: No tenderness. Normal range of motion.     Comments: No edema  Skin:    General: Skin is warm and dry.     Findings: No rash.  Neurological:     Mental Status:  She is alert.     Cranial Nerves: No cranial nerve deficit.     Comments: Patient will lift all 4 extremities but is very sluggish and slowed.  She does not really try to speak.  She tracks with her eyes without difficulty.  She can follow commands.  Psychiatric:     Comments: Unable to assess as right now pt is not speaking     ED Results / Procedures / Treatments   Labs (all labs ordered are listed, but only abnormal results are displayed) Labs Reviewed  SARS CORONAVIRUS 2 BY RT PCR (HOSPITAL ORDER, [place] [facility])  CBC WITH DIFFERENTIAL/PLATELET  COMPREHENSIVE METABOLIC PANEL  URINALYSIS, ROUTINE W REFLEX MICROSCOPIC  TSH  CBG MONITORING, ED  TROPONIN I (HIGH SENSITIVITY)  TROPONIN I (HIGH SENSITIVITY)    EKG EKG Interpretation  Date/Time:  Monday November 11 2019 16:59:44 EDT Ventricular Rate:  93 PR Interval:    QRS Duration: 82 QT Interval:  358 QTC Calculation: 446 R Axis:   0 Text Interpretation: Sinus rhythm Low voltage, precordial leads No significant change since last tracing Confirmed by Blanchie Dessert [id number]) on 11/11/2019 5:05:43 PM   Radiology No results found.  Procedures Procedures (including critical care time)  Medications Ordered in ED Medications  lactated ringers bolus 1,000 mL (has no administration in time range)    ED Course  I have reviewed the triage vital signs and the nursing notes.  Pertinent labs & imaging results that were available during my care of the patient were reviewed by me and considered in my medical decision making (see chart for details).    MDM Rules/Calculators/A&P                          Patient presenting today with altered mental  status for the last 2 to 3 hours.  Patient has a history of Parkinson's disease and had a similar episode in July of this year which the patient had a negative head CT, negative MRI and negative EEG.  It was thought to be acute encephalopathy and did resolve after arrival here.  Has been reports they have been changing her levodopa carbidopa more trying to get her off of it but she has had her normal doses today.  She has not had a bowel movement in 3 days and he does not feel like she is eating or drinking much.  Her vital signs here are stable but she is very lethargic.  Unsure if this is exacerbation of Parkinson's disease versus dehydration versus underlying infection although patient has not had any infectious symptoms.  EKG currently shows no acute findings.  Patient has received her Covid vaccine.  Will give IV fluids and reassess.  11:38 PM Patient's labs are within normal limits.  Head CT, chest x-ray and EKG are negative.  After IV fluids patient is looking around slightly more but still very lethargic with slurred speech and suspect this is most likely exacerbation of her Parkinson's disease.  Will consult neurology and plan on admitting.  MDM Number of Diagnoses or Management Options   Amount and/or Complexity of Data Reviewed Clinical lab tests: ordered and reviewed Tests in the radiology section of CPT: ordered and reviewed Tests in the medicine section of CPT: ordered and reviewed Decide to obtain previous medical records or to obtain history from someone other than the patient: yes Obtain history from someone other than the patient: yes Review and summarize past  medical records: yes Discuss the patient with other providers: yes Independent visualization of images, tracings, or specimens: yes  Risk of Complications, Morbidity, and/or Mortality Presenting problems: moderate Diagnostic procedures: low Management options: low  Patient Progress Patient progress:  stable    Final Clinical Impression(s) / ED Diagnoses Final diagnoses:  Altered mental status, unspecified altered mental status type  Lethargy    Rx / DC Orders ED Discharge Orders    None       Blanchie Dessert, MD 11/11/19 2340    Blanchie Dessert, MD 11/11/19 2352

## 2019-11-12 ENCOUNTER — Observation Stay (HOSPITAL_COMMUNITY): Payer: PPO

## 2019-11-12 DIAGNOSIS — Z20822 Contact with and (suspected) exposure to covid-19: Secondary | ICD-10-CM | POA: Diagnosis present

## 2019-11-12 DIAGNOSIS — R05 Cough: Secondary | ICD-10-CM | POA: Diagnosis present

## 2019-11-12 DIAGNOSIS — G20 Parkinson's disease: Secondary | ICD-10-CM | POA: Diagnosis present

## 2019-11-12 DIAGNOSIS — F0391 Unspecified dementia with behavioral disturbance: Secondary | ICD-10-CM | POA: Diagnosis not present

## 2019-11-12 DIAGNOSIS — G92 Toxic encephalopathy: Secondary | ICD-10-CM | POA: Diagnosis present

## 2019-11-12 DIAGNOSIS — R5383 Other fatigue: Secondary | ICD-10-CM | POA: Diagnosis not present

## 2019-11-12 DIAGNOSIS — Z79899 Other long term (current) drug therapy: Secondary | ICD-10-CM | POA: Diagnosis not present

## 2019-11-12 DIAGNOSIS — Z66 Do not resuscitate: Secondary | ICD-10-CM | POA: Diagnosis present

## 2019-11-12 DIAGNOSIS — R4182 Altered mental status, unspecified: Secondary | ICD-10-CM | POA: Diagnosis not present

## 2019-11-12 DIAGNOSIS — R410 Disorientation, unspecified: Secondary | ICD-10-CM | POA: Diagnosis not present

## 2019-11-12 DIAGNOSIS — R569 Unspecified convulsions: Secondary | ICD-10-CM

## 2019-11-12 DIAGNOSIS — E860 Dehydration: Secondary | ICD-10-CM | POA: Diagnosis present

## 2019-11-12 DIAGNOSIS — R471 Dysarthria and anarthria: Secondary | ICD-10-CM | POA: Diagnosis present

## 2019-11-12 DIAGNOSIS — F0281 Dementia in other diseases classified elsewhere with behavioral disturbance: Secondary | ICD-10-CM | POA: Diagnosis present

## 2019-11-12 LAB — TSH: TSH: 2.42 u[IU]/mL (ref 0.350–4.500)

## 2019-11-12 MED ORDER — QUETIAPINE FUMARATE 25 MG PO TABS
12.5000 mg | ORAL_TABLET | Freq: Two times a day (BID) | ORAL | Status: DC | PRN
  Administered 2019-11-12: 12.5 mg via ORAL
  Filled 2019-11-12 (×2): qty 1

## 2019-11-12 MED ORDER — SODIUM CHLORIDE 0.9 % IV SOLN: INTRAVENOUS | Status: DC

## 2019-11-12 MED ORDER — ONDANSETRON HCL 4 MG/2ML IJ SOLN
4.0000 mg | Freq: Four times a day (QID) | INTRAMUSCULAR | Status: DC | PRN
  Filled 2019-11-12: qty 2

## 2019-11-12 MED ORDER — MIRTAZAPINE 15 MG PO TABS
15.0000 mg | ORAL_TABLET | Freq: Every day | ORAL | Status: DC
  Administered 2019-11-12: 15 mg via ORAL
  Filled 2019-11-12 (×2): qty 1

## 2019-11-12 MED ORDER — ACETAMINOPHEN 650 MG RE SUPP: 650.0000 mg | Freq: Four times a day (QID) | RECTAL | Status: DC | PRN

## 2019-11-12 MED ORDER — ENOXAPARIN SODIUM 30 MG/0.3ML ~~LOC~~ SOLN
30.0000 mg | Freq: Every day | SUBCUTANEOUS | Status: DC
  Administered 2019-11-12 – 2019-11-13 (×2): 30 mg via SUBCUTANEOUS
  Filled 2019-11-12 (×2): qty 0.3

## 2019-11-12 MED ORDER — DONEPEZIL HCL 10 MG PO TABS
10.0000 mg | ORAL_TABLET | Freq: Every morning | ORAL | Status: DC
  Administered 2019-11-12 – 2019-11-13 (×2): 10 mg via ORAL
  Filled 2019-11-12 (×3): qty 1

## 2019-11-12 MED ORDER — ACETAMINOPHEN 325 MG PO TABS: 650.0000 mg | ORAL_TABLET | Freq: Four times a day (QID) | ORAL | Status: DC | PRN

## 2019-11-12 MED ORDER — SODIUM CHLORIDE 0.9% FLUSH
3.0000 mL | Freq: Two times a day (BID) | INTRAVENOUS | Status: DC
  Administered 2019-11-12 – 2019-11-13 (×3): 3 mL via INTRAVENOUS

## 2019-11-12 MED ORDER — MIRTAZAPINE 30 MG PO TABS: 30.0000 mg | ORAL_TABLET | Freq: Every day | ORAL | Status: DC

## 2019-11-12 MED ORDER — POLYETHYLENE GLYCOL 3350 17 G PO PACK: 17.0000 g | PACK | Freq: Every day | ORAL | Status: DC | PRN

## 2019-11-12 MED ORDER — ONDANSETRON HCL 4 MG PO TABS: 4.0000 mg | ORAL_TABLET | Freq: Four times a day (QID) | ORAL | Status: DC | PRN

## 2019-11-12 MED ORDER — CARBIDOPA-LEVODOPA 25-100 MG PO TABS
1.0000 | ORAL_TABLET | ORAL | Status: DC
  Administered 2019-11-12 – 2019-11-13 (×4): 1 via ORAL
  Filled 2019-11-12 (×5): qty 1

## 2019-11-12 NOTE — Progress Notes (Signed)
Pt was seen for mobility and noted to have difficulty with both motor planning and following questions and history information.  Has significant cognitive changes from her dementia, and family is not present at the time of evaluation.  Her plan is to go to rehab to increase control of standing balance, to increase endurance and to promote safer choices of use of AD if possible.  Pt may not be suited to this, and will anticipate family interaction with discharge planning to make this determination.  Will work on PT acutely 3x per week in the event that she is sent home with them.     11/12/19 1300  PT Visit Information  Last PT Received On 11/12/19  Assistance Needed +1  History of Present Illness 69 y.o. female was brought to ED with AMS, after a visit to hosp in last month for similar findings.  Home history is concerning with report of good and bad days of independence or lack with mobility.  PMHx:  Parkinsons, dementia, breast CA, RLS,    Precautions  Precautions Fall  Precaution Comments AxO to self only  Restrictions  Weight Bearing Restrictions No  Home Living  Family/patient expects to be discharged to: Private residence  Living Arrangements Spouse/significant other  Available Help at Discharge Family;Available 24 hours/day  Type of Home House  Home Access Stairs to enter  Entrance Stairs-Number of Steps 5  Home Layout Two level (basement to do laundry)  Alternate Level Stairs-Number of Steps unknown  Bathroom Higher education careers adviser - 2 wheels;Tub bench  Additional Comments husband and children help care for pt  Prior Function  Level of Independence Needs assistance  Gait / Transfers Assistance Needed RW per chart and pt  ADL's / Homemaking Assistance Needed assisted with family  Communication  Communication Expressive difficulties  Pain Assessment  Pain Assessment No/denies pain  Cognition  Arousal/Alertness Awake/alert   Behavior During Therapy Flat affect  Overall Cognitive Status History of cognitive impairments - at baseline  Upper Extremity Assessment  Upper Extremity Assessment Overall WFL for tasks assessed (coordination for mobiltiy is a struggle')  Lower Extremity Assessment  Lower Extremity Assessment  (coordination is poor)  Cervical / Trunk Assessment  Cervical / Trunk Assessment Kyphotic  Bed Mobility  Overal bed mobility Needs Assistance  Bed Mobility Supine to Sit;Sit to Supine  Supine to sit Min assist  Sit to supine Min assist  General bed mobility comments min assist with pt requiring dense cues for safety  Transfers  Overall transfer level Needs assistance  Equipment used Rolling walker (2 wheeled);1 person hand held assist  Transfers Sit to/from Stand  Sit to Stand Min assist  General transfer comment used walker initially but pt does not appear to understand how to control it despite having one at home  Ambulation/Gait  Ambulation/Gait assistance Min assist  Gait Distance (Feet) 125 Feet  Assistive device Rolling walker (2 wheeled);1 person hand held assist  Gait Pattern/deviations Step-through pattern;Wide base of support;Decreased stride length;Staggering left;Staggering right  General Gait Details lateral instability with HHA  Gait velocity reduced  Gait velocity interpretation <1.8 ft/sec, indicate of risk for recurrent falls  Balance  Overall balance assessment Needs assistance  Sitting-balance support Feet supported  Sitting balance-Leahy Scale Fair  Standing balance support Single extremity supported  Standing balance-Leahy Scale Poor  Standing balance comment pt is unfocused and requires contact at all times to steady herself  General Comments  General comments (skin integrity,  edema, etc.) pt is getting up to walk with help but is unsafe, not aware of her lack of balance and tries to walk away from physical assistance  Exercises  Exercises Other exercises (LE  strength is 4 to 4+)  PT - End of Session  Activity Tolerance Patient limited by fatigue;Treatment limited secondary to medical complications (Comment)  Patient left in bed;with call bell/phone within reach;with nursing/sitter in room  Nurse Communication Mobility status  PT Assessment  PT Recommendation/Assessment Patient needs continued PT services  PT Visit Diagnosis Unsteadiness on feet (R26.81);Muscle weakness (generalized) (M62.81);Difficulty in walking, not elsewhere classified (R26.2);Apraxia (R48.2)  PT Problem List Decreased strength;Decreased range of motion;Decreased coordination;Decreased cognition;Decreased balance;Decreased safety awareness  Barriers to Discharge Inaccessible home environment  Barriers to Discharge Comments has stairs at home which will need to be monitored by family until safely able to use them  PT Plan  PT Frequency (ACUTE ONLY) Min 3X/week  PT Treatment/Interventions (ACUTE ONLY) DME instruction;Gait training;Stair training;Functional mobility training;Therapeutic activities;Therapeutic exercise;Balance training;Neuromuscular re-education;Patient/family education  AM-PAC PT "6 Clicks" Mobility Outcome Measure (Version 2)  Help needed turning from your back to your side while in a flat bed without using bedrails? 4  Help needed moving from lying on your back to sitting on the side of a flat bed without using bedrails? 3  Help needed moving to and from a bed to a chair (including a wheelchair)? 3  Help needed standing up from a chair using your arms (e.g., wheelchair or bedside chair)? 3  Help needed to walk in hospital room? 3  Help needed climbing 3-5 steps with a railing?  1  6 Click Score 17  Consider Recommendation of Discharge To: Home with [facility]  PT Recommendation  Follow Up Recommendations SNF  PT equipment Rolling walker with 5" wheels (if hers is not in good repair)  Individuals Consulted  Consulted and Agree with Results and Recommendations  Patient unable/family or caregiver not available  Acute Rehab PT Goals  Patient Stated Goal none stated  PT Goal Formulation Patient unable to participate in goal setting  Time For Goal Achievement 11/26/19  Potential to Achieve Goals Fair  PT Time Calculation  PT Start Time (ACUTE ONLY) 1055  PT Stop Time (ACUTE ONLY) 1124  PT Time Calculation (min) (ACUTE ONLY) 29 min  PT Evaluation  $PT Eval Moderate Complexity 1 Mod  PT Treatments  $Gait Training 8-22 mins  Written Expression  Dominant Hand Right   Mee Hives, PT MS Acute Rehab Dept. Number: [place]

## 2019-11-12 NOTE — Progress Notes (Signed)
EEG complete - results pending 

## 2019-11-12 NOTE — H&P (Signed)
[facility] History and Physical  CYRILLA DURKIN ZHG:[id number] DOB: 1950/03/10 DOA: 11/11/2019  Referring physician: Dr. Maryan Rued PCP: Wenda Low, MD   Chief Complaint: Altered mental status  HPI: Kellie Moore is a 69 y.o. female with history of Parkinson's disease, dementia, who presents with altered mental status.  Patient was admitted last month after her family found her to be altered and dysarthric.  CT head and EEG were unremarkable.  Her mental status returned to baseline spontaneously the day after admission and she was discharged home.  Unable to obtain history from patient due to her dysarthria, she is awake and responding to questions but is unintelligible.  Called and spoke with husband Marcello Moores on the phone before being disconnected, attempted to call again but went straight to voicemail. Husband gives confusing history, but appears that earlier today patient became unresponsive and he became concerned and called EMS. He reports that it was very similar to her presentation last month. On further history he reports that she is sometimes unresponsive but then will improve it later times in the day. I tried to clarify to what degree her presentation today was different from her baseline but was unable to obtain a good history. He also reports that he feels that her carbidopa is causing some of the symptoms and that they had recently stopped it several days ago. When asked what her baseline is he alternately says that she is able to ambulate independently with a walker and speak very clearly, but at other times says she has difficulty moving around and being understood, again it is unclear to me from our conversation to what degree her presentation today varies from her normal parkinsonian fluctuations.  In the ED vital signs notable only for hypertension but otherwise unremarkable.  Thorough lab work-up unremarkable.  CMP and CBC normal, serial troponins negative, UA unremarkable,  Covid test negative, EKG unremarkable, chest x-ray without acute findings, and CT head normal.  She was given a 1 L LR bolus and started on normal saline drip.  Neurology was consulted by ED providers with plan to see the patient and give recommendations.  She was admitted for further management of her altered mental status.  Review of Systems:  Unable to complete due to patient's condition: Dysarthria  Past Medical History:  Diagnosis Date  . Cancer [facility])    Breast cancer  . Parkinson's disease ([place])   . Parkinson's disease dementia [facility])    Past Surgical History:  Procedure Laterality Date  . BREAST LUMPECTOMY Right    radiation   Social History:  reports that she has never smoked. She has never used smokeless tobacco. She reports previous alcohol use. She reports that she does not use drugs.  Allergies  Allergen Reactions  . Other Itching and Other (See Comments)    Dust and pollen = Itchy eyes and runny nose    Family History  Problem Relation Age of Onset  . Brain cancer Father      Prior to Admission medications   Medication Sig Start Date End Date Taking? Authorizing Provider  atropine 1 % ophthalmic solution For drooling,these drops can be diluted 72ml in 192ml of water and used as a mouth rinse up to three times a day. You can also try 2 drops under the tongue up to 3 times a day without diluting it if that works better for you Patient taking differently: Place 1-2 drops under the tongue See admin instructions. "For drooling, these drops can be diluted  1 ml in 100 ml's of water and used as a mouth rinse up to three times a day. You can also try 2 drops under the tongue up to 3 times a day without diluting it if that works better for you." 06/27/19   Cameron Sprang, MD  bisacodyl (BISACODYL) 5 MG EC tablet Take 5 mg by mouth at bedtime as needed for mild constipation or moderate constipation.    [provider]  calcium carbonate (TUMS EX) 750 MG chewable tablet  Chew 1 tablet by mouth as needed for heartburn (or reflux).    [provider]  carbidopa-levodopa (SINEMET IR) 25-100 MG tablet Take 1 tablet three times a day with meals Patient taking differently: Take 1 tablet by mouth See admin instructions. Take 1 tablet by mouth three times a day with meals- 9:30 AM, 1 PM, and 6 PM 08/28/19   Cameron Sprang, MD  donepezil (ARICEPT) 10 MG tablet Take 1/2 tablet daily for 2 weeks, then increase to 1 tablet daily Patient taking differently: Take 10 mg by mouth in the morning.  08/28/19   Cameron Sprang, MD  mirtazapine (REMERON) 15 MG tablet TAKE 2 TABLETS BY MOUTH AT BEDTIME Patient taking differently: Take 30 mg by mouth at bedtime.  06/27/19   Cameron Sprang, MD  Multiple Vitamins-Minerals (ONE-A-DAY WOMENS 50 PLUS) TABS Take 1 tablet by mouth daily with breakfast.    [provider]  oxymetazoline (AFRIN) 0.05 % nasal spray Place 1 spray into both nostrils 2 (two) times daily as needed for congestion.    [provider]  oxymetazoline (VICKS SINEX SEVERE DECONGEST) 0.05 % nasal spray Place 1 spray into both nostrils 2 (two) times daily as needed for congestion.    [provider]   Physical Exam: Vitals:   11/11/19 2145 11/11/19 2200 11/11/19 2230 11/11/19 2245  BP: (!) 144/91 (!) 155/90 (!) 154/93 (!) 155/85  Pulse: 70 65 65 65  Resp: 18 (!) 22 20 16   Temp:      TempSrc:      SpO2: 98% 99% 100% 100%  Weight:      Height:        Wt Readings from Last 3 Encounters:  11/11/19 40.8 kg  09/23/19 44.3 kg  08/28/19 45.4 kg     . General:  Appears agitated . Eyes: PERRL, normal lids, irises & conjunctiva . ENT: grossly normal hearing . Neck: no masses . Cardiovascular: RRR, no m/r/g. No LE edema. Marland Kitchen Respiratory: CTA bilaterally, no w/r/r. Normal respiratory effort. . Abdomen: soft, ntnd . Skin: no rash or induration seen on limited exam . Musculoskeletal: grossly normal tone BUE/BLE . Psychiatric: Anxious  affect . Neurologic: Responds to questions but speech is dysarthric and incoherent          Labs on Admission:  Basic Metabolic Panel: Recent Labs  Lab 11/11/19 1720  NA 138  K 4.3  CL 101  CO2 27  GLUCOSE 96  BUN 14  CREATININE 0.82  CALCIUM 9.8   Liver Function Tests: Recent Labs  Lab 11/11/19 1720  AST 26  ALT 11  ALKPHOS 69  BILITOT 0.7  PROT 7.0  ALBUMIN 4.2   No results for input(s): LIPASE, AMYLASE in the last 168 hours. No results for input(s): AMMONIA in the last 168 hours. CBC: Recent Labs  Lab 11/11/19 1720  WBC 6.3  NEUTROABS 4.1  HGB 13.6  HCT 42.1  MCV 96.3  PLT 221   Cardiac Enzymes:  No results for input(s): CKTOTAL, CKMB, CKMBINDEX, TROPONINI in the last 168 hours.  BNP (last 3 results) No results for input(s): BNP in the last 8760 hours.  ProBNP (last 3 results) No results for input(s): PROBNP in the last 8760 hours.  CBG: Recent Labs  Lab 11/11/19 1612  GLUCAP 97    Radiological Exams on Admission: CT HEAD WO CONTRAST  Result Date: 11/11/2019 CLINICAL DATA:  Encephalopathy. History of Parkinson's disease and breast carcinoma. EXAM: CT HEAD WITHOUT CONTRAST TECHNIQUE: Contiguous axial images were obtained from the base of the skull through the vertex without intravenous contrast. COMPARISON:  Head CT 09/23/2019 FINDINGS: Brain: There is no mass, hemorrhage or extra-axial collection. The size and configuration of the ventricles and extra-axial CSF spaces are normal. The brain parenchyma is normal, without acute or chronic infarction. Vascular: No abnormal hyperdensity of the major intracranial arteries or dural venous sinuses. No intracranial atherosclerosis. Skull: The visualized skull base, calvarium and extracranial soft tissues are normal. Sinuses/Orbits: No fluid levels or advanced mucosal thickening of the visualized paranasal sinuses. No mastoid or middle ear effusion. The orbits are normal. IMPRESSION: Normal head CT.  Electronically Signed   By: Ulyses Jarred M.D.   On: 11/11/2019 22:27   DG Chest Port 1 View  Result Date: 11/11/2019 CLINICAL DATA:  Altered mental status, catatonic for 2 hours EXAM: PORTABLE CHEST 1 VIEW COMPARISON:  CT 06/19/2017 FINDINGS: Hyperinflation is similar to comparison studies. Few rounded mineralization seen in the right infrahilar lung, could reflect stacked broncholith in a patient with a history of prior mucoid impaction. No other consolidation, features of edema, pneumothorax, or effusion. Pulmonary vascularity is normally distributed. The aorta is calcified. The remaining cardiomediastinal contours are unremarkable. No acute osseous or soft tissue abnormality. Telemetry leads overlie the chest. IMPRESSION: A few rounded mineralization in the right infrahilar lung, could reflect small broncholiths in a patient with a history of prior mucoid impaction. No acute consolidative opacities. Chronic hyperinflation. Aortic Atherosclerosis (ICD10-I70.0). Electronically Signed   By: Lovena Le M.D.   On: 11/11/2019 17:29    EKG: Independently reviewed.  Sinus rhythm, T wave inversions in V2, no other ischemic changes.  No significant change compared to prior.  Assessment/Plan Active Problems:   AMS (altered mental status)   #AMS Unclear etiology. Completely unremarkable lab and imaging work-up. History from husband is unclear, I suspect combination of advanced of her dementia, Parkinson's, and sundowning. Neurology aware of patient and are following. -Follow-up neurology recommendations  #Failed swallow test Per ED provider failed bedside swallow test. -SLP consult  #Chronic medical problems Unable to complete med rec, will need to be done in a.m.  Code Status: Full code unconfirmed. On last admission patient was DNR but could not confirm this with husband. DVT Prophylaxis: Lovenox Family Communication: Husband Marcello Moores updated Disposition Plan: Observation  Time spent: 50  min  Clarnce Flock MD/MPH [facility]

## 2019-11-12 NOTE — Progress Notes (Signed)
  PROGRESS NOTE  Patient admitted earlier this morning. See H&P.   Kellie Moore is a 69 y.o. female with history of Parkinson's disease, dementia, who presents with altered mental status. CT head negative. Neurology consulted.  Question possible waxing/waning delirium in the setting of Lewy body dementia.  Patient seen and evaluated in the emergency department.  She is alert and oriented to self only.  She remains a very poor historian, remains confused.  Neurology planning for EEG.  Medications adjusted.  PT OT SLP eval pending.   Status is: Observation  The patient will require care spanning > 2 midnights and should be moved to inpatient because: Altered mental status and Unsafe d/c plan  Dispo: The patient is from: Home              Anticipated d/c is to: Unclear, awaiting PT OT eval and safety of returning back to home environment               Anticipated d/c date is: 1 day              Patient currently is not medically stable to d/c. Awaiting EEG, PT OT SLP eval. Remains confused on exam.     [name], DO [facility] 11/12/2019, 9:58 AM  Available via Epic secure chat 7am-7pm After these hours, please refer to coverage provider listed on [email or web address]

## 2019-11-12 NOTE — Consult Note (Signed)
Requesting Physician: Dr. Maryan Rued    Chief Complaint: Slurred speech, lethargy  History obtained from: Patient and Chart   HPI:                                                                                                                                       Kellie Moore is a 69 y.o. female with advanced Parkinson's and Parkinson's dementia, breast cancer presents to the ED by EMS for altered mental status. According to EDP chart reviewed patient woke up this morning her normal self however after having breakfast went back to sleep and continued to be lethargic and not speaking unable to get up on her own.  Patient has been adjusting carbidopa dose-Per Dr. Delice Lesch plan was to wean the medication however due to increased rigidity has been restarted since Saturday.     Past Medical History:  Diagnosis Date  . Cancer [facility])    Breast cancer  . Parkinson's disease ([place])   . Parkinson's disease dementia [facility])     Past Surgical History:  Procedure Laterality Date  . BREAST LUMPECTOMY Right    radiation    Family History  Problem Relation Age of Onset  . Brain cancer Father    Social History:  reports that she has never smoked. She has never used smokeless tobacco. She reports previous alcohol use. She reports that she does not use drugs.  Allergies:  Allergies  Allergen Reactions  . Other Itching and Other (See Comments)    Dust and pollen = Itchy eyes and runny nose    Medications:                                                                                                                        I reviewed home medications   ROS:  14 systems reviewed and negative except above    Examination:                                                                                                      General: Appears well-developed and  well-nourished.  Psych: Affect appropriate to situation Eyes: No scleral injection HENT: No OP obstrucion Head: Normocephalic.  Cardiovascular: Normal rate and regular rhythm.  Respiratory: Effort normal and breath sounds normal to anterior ascultation GI: Soft.  No distension. There is no tenderness.  Skin: WDI    Neurological Examination Mental Status: Alert, oriented only to self, not location or month or her age.  Oriented. Speech fluent without evidence of aphasia. Able to follow 3 step commands without difficulty. Cranial Nerves: II: Visual fields grossly normal,  III,IV, VI: ptosis not present, extra-ocular motions intact bilaterally, pupils equal, round, reactive to light and accommodation V,VII: smile symmetric, facial light touch sensation normal bilaterally VIII: hearing normal bilaterally IX,X: uvula rises symmetrically XI: bilateral shoulder shrug XII: midline tongue extension Motor: Right : Upper extremity   5/5    Left:     Upper extremity   5/5  Lower extremity   5/5     Lower extremity   5/5 Tone and bulk:: bilateral arm tremors, cogwheel rigidity Sensory: Pinprick and light touch intact throughout, bilaterally Plantars: Right: downgoing   Left: downgoing Cerebellar: normal finger-to-nose,     Lab Results: Basic Metabolic Panel: Recent Labs  Lab 11/11/19 1720  NA 138  K 4.3  CL 101  CO2 27  GLUCOSE 96  BUN 14  CREATININE 0.82  CALCIUM 9.8    CBC: Recent Labs  Lab 11/11/19 1720  WBC 6.3  NEUTROABS 4.1  HGB 13.6  HCT 42.1  MCV 96.3  PLT 221    Coagulation Studies: No results for input(s): LABPROT, INR in the last 72 hours.  Imaging: CT HEAD WO CONTRAST  Result Date: 11/11/2019 CLINICAL DATA:  Encephalopathy. History of Parkinson's disease and breast carcinoma. EXAM: CT HEAD WITHOUT CONTRAST TECHNIQUE: Contiguous axial images were obtained from the base of the skull through the vertex without intravenous contrast. COMPARISON:  Head CT  09/23/2019 FINDINGS: Brain: There is no mass, hemorrhage or extra-axial collection. The size and configuration of the ventricles and extra-axial CSF spaces are normal. The brain parenchyma is normal, without acute or chronic infarction. Vascular: No abnormal hyperdensity of the major intracranial arteries or dural venous sinuses. No intracranial atherosclerosis. Skull: The visualized skull base, calvarium and extracranial soft tissues are normal. Sinuses/Orbits: No fluid levels or advanced mucosal thickening of the visualized paranasal sinuses. No mastoid or middle ear effusion. The orbits are normal. IMPRESSION: Normal head CT. Electronically Signed   By: Ulyses Jarred M.D.   On: 11/11/2019 22:27   DG Chest Port 1 View  Result Date: 11/11/2019 CLINICAL DATA:  Altered mental status, catatonic for 2 hours EXAM: PORTABLE CHEST 1 VIEW COMPARISON:  CT 06/19/2017 FINDINGS: Hyperinflation is similar to comparison studies. Few rounded mineralization seen in the right infrahilar lung, could reflect stacked broncholith in a patient with  a history of prior mucoid impaction. No other consolidation, features of edema, pneumothorax, or effusion. Pulmonary vascularity is normally distributed. The aorta is calcified. The remaining cardiomediastinal contours are unremarkable. No acute osseous or soft tissue abnormality. Telemetry leads overlie the chest. IMPRESSION: A few rounded mineralization in the right infrahilar lung, could reflect small broncholiths in a patient with a history of prior mucoid impaction. No acute consolidative opacities. Chronic hyperinflation. Aortic Atherosclerosis (ICD10-I70.0). Electronically Signed   By: Lovena Le M.D.   On: 11/11/2019 17:29     I have reviewed the above imaging: CT head, MRI Brain from July    ASSESSMENT AND PLAN  69 year old female with Parkinson's disease and dementia presents with altered mental status.  Metabolic work-up including urinalysis is negative for  infection. On assessment patient awake and alert, still confused.  Possibly waxing/waning delirium in the setting of Lewy body dementia.   Acute Toxic metabolic encephalopathy  Recommendations -Routine EEG -Reduce dose of mirtazapine -Consider low-dose Seroquel 12.5 mg QHS at night if agitated. May need to consider REM sleep disorder if recurrently drowsy in the morning.  -PT OT eval -Follow-up with neurology as outpatient    Cosme Jacob [facility] Pager Number [phone]

## 2019-11-12 NOTE — Procedures (Signed)
Patient Name: Kellie Moore  MRN: [id number]  Epilepsy Attending: Lora Havens  Referring Physician/Provider: Dr Karena Addison Aroor Date: 11/12/2019  Duration: 33.21 mins  Patient history: 69 year old female with Parkinson's disease and dementia presents with altered mental status.  EEG to evaluate for seizure.  Level of alertness: Awake  AEDs during EEG study: None  Technical aspects: This EEG study was done with scalp electrodes positioned according to the 10-20 International system of electrode placement. Electrical activity was acquired at a sampling rate of 500Hz  and reviewed with a high frequency filter of 70Hz  and a low frequency filter of 1Hz . EEG data were recorded continuously and digitally stored.   Description: The posterior dominant rhythm consists of 8-9 Hz activity of moderate voltage (25-35 uV) seen predominantly in posterior head regions, symmetric and reactive to eye opening and eye closing. Hyperventilation and photic stimulation were not performed.     IMPRESSION: This study is within normal limits. No seizures or epileptiform discharges were seen throughout the recording.  Kellie Moore Kellie Moore

## 2019-11-12 NOTE — TOC Initial Note (Signed)
Transition of Care [facility]) - Initial/Assessment Note    Patient Details  Name: AIKO BELKO MRN: [id number] Date of Birth: 1951/01/18  [facility] CM/SW Contact:    Archie Endo, LCSW Phone Number: 11/12/2019, 2:04 PM  Clinical Narrative:                 CSW spoke with patient's husband Marcello Moores to complete assessment. Marcello Moores reports he and his wife live at home alone. Marcello Moores states his wife receives home health services including PT, OT, and SLP. Marcello Moores reports his wife will refuse to go to a SNF at discharge but did state she has frequent falls at home. Marcello Moores states he will speak with his wife about the importance of her going to a facility at discharge. At the time of this assessment, the patient is only oriented to self.  CSW spoke with Crystal at [facility] who states the patient receives home health services through [facility].   TOC to continue following for discharge planning. Patient will require a PASSR screening if she is agreeable to SNF placement.   Expected Discharge Plan: [place] Barriers to Discharge: Continued Medical Work up   Patient Goals and CMS Choice Patient states their goals for this hospitalization and ongoing recovery are:: Return home      Expected Discharge Plan and Services Expected Discharge Plan: [place]       Living arrangements for the past 2 months: Single Family Home                                      Prior Living Arrangements/Services Living arrangements for the past 2 months: Single Family Home Lives with:: Spouse Patient language and need for interpreter reviewed:: Yes Do you feel safe going back to the place where you live?: Yes      Need for Family Participation in Patient Care: Yes (Comment) Care giver support system in place?: Yes (comment) Current home services: Home OT, Home PT, Other (comment) (Speech) Criminal Activity/Legal Involvement Pertinent to Current  Situation/Hospitalization: No - Comment as needed  Activities of Daily Living      Permission Sought/Granted                  Emotional Assessment Appearance:: Appears stated age Attitude/Demeanor/Rapport: Engaged Affect (typically observed): Accepting Orientation: : Oriented to Self Alcohol / Substance Use: Not Applicable Psych Involvement: No (comment)  Admission diagnosis:  AMS (altered mental status) [R41.82] Patient Active Problem List   Diagnosis Date Noted  . AMS (altered mental status) 11/12/2019  . Acute encephalopathy 09/23/2019  . Parkinson's disease ([place]) 09/23/2019  . Dementia ([place]) 09/23/2019   PCP:  Wenda Low, MD Pharmacy:   [place], [place] - [address] [place] [address] Phone: [phone] Fax: [phone]     Social Determinants of Health (SDOH) Interventions    Readmission Risk Interventions No flowsheet data found.

## 2019-11-12 NOTE — ED Notes (Signed)
Pt is more alert, is asking to speak with husband, husband called and they are conversing on the phone. Pt asking what is happening and is moving all extremities. Pt on and off the bedpan with assistance.

## 2019-11-12 NOTE — ED Notes (Signed)
Patient up walking to bathroom with 2 person assist, gait steady. Voided. Patient up talking alert and oriented x2. Oral care given,

## 2019-11-13 DIAGNOSIS — G20 Parkinson's disease: Secondary | ICD-10-CM

## 2019-11-13 DIAGNOSIS — F0391 Unspecified dementia with behavioral disturbance: Secondary | ICD-10-CM

## 2019-11-13 DIAGNOSIS — R5383 Other fatigue: Secondary | ICD-10-CM

## 2019-11-13 NOTE — Discharge Summary (Signed)
Kellie Moore WCH:[id number] DOB: 25-Sep-1950 DOA: 11/11/2019  PCP: Wenda Low, MD  Admit date: 11/11/2019 Discharge date: 11/13/2019  Admitted From: Home Disposition: Home  Recommendations for Outpatient Follow-up:  1. Follow up with PCP in 1-2 weeks 2. Referral to Speech and emphasized close follow-up with her outpatient neurology, also provided neurology referral per husband request   Home Health: Speech therapy Equipment/Devices: None Discharge Condition: Stable CODE STATUS: Full Diet recommendation: Dysphagia 3  Brief/Interim Summary: History of present illness:  Kellie Moore is a 69 y.o. year old female with medical history significant for Parkinson's dementia who presented on 11/11/2019 with increased lethargy and inability to speak as reported by her husband in the setting of discontinuing her carbidopa dose as recommended by her neurologist Dr. Delice Lesch  Metabolic work-up included UA negative for infection, CT head which was unremarkable.  EEG which showed no signs of seizures.  Neurology was consulted to assist a further work-up.  After 48 hours hours patient returned to her previous neurologic baseline (confirmed by husband) she was able to converse and follow commands was oriented to self and place on discharge.     Remaining hospital course addressed in problem based format below:   Hospital Course:   Acute encephalopathy, resolved.  Unclear etiology.  Suspect transient worsening of her baseline dementia with transient behavioral disturbances.  Partial seizure is also on the differential however patient is not have any recurrent episodes while in hospital and no seizure-like activity on EEG -Instructed husband to follow-up closely with her neurologist on discharge to discuss with outpatient, neurology did not recommend starting any AEDs at this time  Parkinson dementia, with behavioral disturbances -Started on low-dose Seroquel in the hospital for agitation, will not  continue on discharge given return to baseline neurologic status -Continue carbidopa dose on discharge, instructed husband to follow-up with her neurologist to discuss any further plans to wean to  -Continue dysphagia 3/mechanical soft diet, home health SLP discharge   Consultations:  Neurology  Procedures/Studies: EEG, 9/14  Description: The posterior dominant rhythm consists of 8-9 Hz activity of moderate voltage (25-35 uV) seen predominantly in posterior head regions, symmetric and reactive to eye opening and eye closing. Hyperventilation and photic stimulation were not performed.     IMPRESSION: This study is within normal limits. No seizures or epileptiform discharges were seen throughout the recording.   Subjective:  Discharge Exam: Vitals:   11/13/19 0745 11/13/19 1110  BP: 130/77 109/66  Pulse: 60 (!) 57  Resp: 20 18  Temp: 97.7 F (36.5 C) 97.6 F (36.4 C)  SpO2: 99% 98%   Vitals:   11/12/19 2032 11/13/19 0340 11/13/19 0745 11/13/19 1110  BP: (!) 165/80 (!) 157/85 130/77 109/66  Pulse: 62 67 60 (!) 57  Resp:  18 20 18   Temp:  98.6 F (37 C) 97.7 F (36.5 C) 97.6 F (36.4 C)  TempSrc:   Oral Oral  SpO2:  98% 99% 98%  Weight:      Height:        General: Lying in bed, no apparent distress Eyes: EOMI, anicteric ENT: Oral Mucosa clear and moist Cardiovascular: regular rate and rhythm, no murmurs, rubs or gallops, no edema, Respiratory: Normal respiratory effort on room air, lungs clear to auscultation bilaterally Abdomen: soft, non-distended, non-tender, normal bowel sounds Skin: No Rash Neurologic: Grossly no focal neuro deficit.Mental status alert to self, place only speech normal, Psychiatric:Appropriate affect, and mood  Discharge Diagnoses:  Active Problems:   Acute encephalopathy  Parkinson's disease ([place])   Dementia ([place])   AMS (altered mental status)    Discharge Instructions  Discharge Instructions    Ambulatory referral to Neurology    Complete by: As directed    An appointment is requested in approximately: 2 weeks   Ambulatory referral to Speech Therapy   Complete by: As directed    Diet - low sodium heart healthy   Complete by: As directed    Increase activity slowly   Complete by: As directed      Allergies as of 11/13/2019      Reactions   Other Itching, Other (See Comments)   Dust and pollen = Itchy eyes and runny nose      Medication List    TAKE these medications   atropine 1 % ophthalmic solution For drooling,these drops can be diluted 30ml in 138ml of water and used as a mouth rinse up to three times a day. You can also try 2 drops under the tongue up to 3 times a day without diluting it if that works better for you What changed:   how much to take  how to take this  when to take this  additional instructions   bisacodyl 5 MG EC tablet Generic drug: bisacodyl Take 5 mg by mouth at bedtime as needed for mild constipation or moderate constipation.   calcium carbonate 750 MG chewable tablet Commonly known as: TUMS EX Chew 1 tablet by mouth as needed for heartburn (or reflux).   carbidopa-levodopa 25-100 MG tablet Commonly known as: SINEMET IR Take 1 tablet three times a day with meals What changed:   how much to take  how to take this  when to take this  additional instructions Notes to patient: This evening, 11/13/19.   donepezil 10 MG tablet Commonly known as: ARICEPT Take 1/2 tablet daily for 2 weeks, then increase to 1 tablet daily What changed:   how much to take  how to take this  when to take this  additional instructions Notes to patient: Tomorrow, 11/14/19.   mirtazapine 15 MG tablet Commonly known as: REMERON TAKE 2 TABLETS BY MOUTH AT BEDTIME What changed:   how much to take  how to take this  when to take this  additional instructions Notes to patient: Tonight, 11/13/19.   oxymetazoline 0.05 % nasal spray Commonly known as: AFRIN Place 1 spray into  both nostrils 2 (two) times daily as needed for congestion.   Vicks Sinex Severe Decongest 0.05 % nasal spray Generic drug: oxymetazoline Place 1 spray into both nostrils 2 (two) times daily as needed for congestion.       Follow-up Information    [facility]. Schedule an appointment as soon as possible for a visit in 2 week(s).   Contact information: [address]     [address] [place] [address] [place] Follow up.   Specialty: Home Health Services Why: Representative from [facility] will contact you to schedule home health services. Contact information: [place] [address] [phone]              Allergies  Allergen Reactions  . Other Itching and Other (See Comments)    Dust and pollen = Itchy eyes and runny nose        The results of significant diagnostics from this hospitalization (including imaging, microbiology, ancillary and laboratory) are listed below for reference.  Microbiology: Recent Results (from the past 240 hour(s))  SARS Coronavirus 2 by RT PCR (hospital order, performed in [facility] hospital lab) Nasopharyngeal Nasopharyngeal Swab     Status: None   Collection Time: 11/11/19  8:04 PM   Specimen: Nasopharyngeal Swab  Result Value Ref Range Status   SARS Coronavirus 2 NEGATIVE NEGATIVE Final    Comment: (NOTE) SARS-CoV-2 target nucleic acids are NOT DETECTED.  The SARS-CoV-2 RNA is generally detectable in upper and lower respiratory specimens during the acute phase of infection. The lowest concentration of SARS-CoV-2 viral copies this assay can detect is 250 copies / mL. A negative result does not preclude SARS-CoV-2 infection and should not be used as the sole basis for treatment or other patient management decisions.  A negative result may occur with improper specimen collection / handling, submission of specimen  other than nasopharyngeal swab, presence of viral mutation(s) within the areas targeted by this assay, and inadequate number of viral copies (<250 copies / mL). A negative result must be combined with clinical observations, patient history, and epidemiological information.  Fact Sheet for Patients:   StrictlyIdeas.no  Fact Sheet for Healthcare Providers: BankingDealers.co.za  This test is not yet approved or  cleared by the [place] FDA and has been authorized for detection and/or diagnosis of SARS-CoV-2 by FDA under an Emergency Use Authorization (EUA).  This EUA will remain in effect (meaning this test can be used) for the duration of the COVID-19 declaration under Section 564(b)(1) of the Act, 21 U.S.C. section 360bbb-3(b)(1), unless the authorization is terminated or revoked sooner.  Performed at [facility], [place] [address] [place], [place] [address]      Labs: BNP (last 3 results) No results for input(s): BNP in the last 8760 hours. Basic Metabolic Panel: Recent Labs  Lab 11/11/19 1720  NA 138  K 4.3  CL 101  CO2 27  GLUCOSE 96  BUN 14  CREATININE 0.82  CALCIUM 9.8   Liver Function Tests: Recent Labs  Lab 11/11/19 1720  AST 26  ALT 11  ALKPHOS 69  BILITOT 0.7  PROT 7.0  ALBUMIN 4.2   No results for input(s): LIPASE, AMYLASE in the last 168 hours. No results for input(s): AMMONIA in the last 168 hours. CBC: Recent Labs  Lab 11/11/19 1720  WBC 6.3  NEUTROABS 4.1  HGB 13.6  HCT 42.1  MCV 96.3  PLT 221   Cardiac Enzymes: No results for input(s): CKTOTAL, CKMB, CKMBINDEX, TROPONINI in the last 168 hours. BNP: Invalid input(s): POCBNP CBG: Recent Labs  Lab 11/11/19 1612  GLUCAP 97   D-Dimer No results for input(s): DDIMER in the last 72 hours. Hgb A1c No results for input(s): HGBA1C in the last 72 hours. Lipid Profile No results for input(s): CHOL, HDL, LDLCALC, TRIG, CHOLHDL,  LDLDIRECT in the last 72 hours. Thyroid function studies Recent Labs    11/12/19 1003  TSH 2.420   Anemia work up No results for input(s): VITAMINB12, FOLATE, FERRITIN, TIBC, IRON, RETICCTPCT in the last 72 hours. Urinalysis    Component Value Date/Time   COLORURINE YELLOW 11/11/2019 2000   APPEARANCEUR CLEAR 11/11/2019 2000   LABSPEC 1.009 11/11/2019 2000   PHURINE 7.0 11/11/2019 2000   GLUCOSEU NEGATIVE 11/11/2019 2000   GLUCOSEU NEGATIVE 08/22/2019 1141   HGBUR NEGATIVE 11/11/2019 2000   BILIRUBINUR NEGATIVE 11/11/2019 2000   KETONESUR NEGATIVE 11/11/2019 2000   PROTEINUR NEGATIVE 11/11/2019 2000   UROBILINOGEN 0.2 08/22/2019 1141   NITRITE NEGATIVE 11/11/2019  [place] 11/11/2019 2000   Sepsis Labs Invalid input(s): PROCALCITONIN,  WBC,  LACTICIDVEN Microbiology Recent Results (from the past 240 hour(s))  SARS Coronavirus 2 by RT PCR (hospital order, performed in [facility] hospital lab) Nasopharyngeal Nasopharyngeal Swab     Status: None   Collection Time: 11/11/19  8:04 PM   Specimen: Nasopharyngeal Swab  Result Value Ref Range Status   SARS Coronavirus 2 NEGATIVE NEGATIVE Final    Comment: (NOTE) SARS-CoV-2 target nucleic acids are NOT DETECTED.  The SARS-CoV-2 RNA is generally detectable in upper and lower respiratory specimens during the acute phase of infection. The lowest concentration of SARS-CoV-2 viral copies this assay can detect is 250 copies / mL. A negative result does not preclude SARS-CoV-2 infection and should not be used as the sole basis for treatment or other patient management decisions.  A negative result may occur with improper specimen collection / handling, submission of specimen other than nasopharyngeal swab, presence of viral mutation(s) within the areas targeted by this assay, and inadequate number of viral copies (<250 copies / mL). A negative result must be combined with clinical observations, patient history, and  epidemiological information.  Fact Sheet for Patients:   StrictlyIdeas.no  Fact Sheet for Healthcare Providers: BankingDealers.co.za  This test is not yet approved or  cleared by the [place] FDA and has been authorized for detection and/or diagnosis of SARS-CoV-2 by FDA under an Emergency Use Authorization (EUA).  This EUA will remain in effect (meaning this test can be used) for the duration of the COVID-19 declaration under Section 564(b)(1) of the Act, 21 U.S.C. section 360bbb-3(b)(1), unless the authorization is terminated or revoked sooner.  Performed at [facility], [place] [address] [place],  [address]      Time coordinating discharge: Over 30 minutes  SIGNED:   Desiree Hane, MD  [facility] 11/13/2019, 5:25 PM Pager   If 7PM-7AM, please contact night-coverage [email or web address] Password [email or web address]

## 2019-11-13 NOTE — Progress Notes (Signed)
NEURO HOSPITALIST PROGRESS NOTE   Subjective: Today Kellie Moore is alert and sitting up in bed, her husband is at bedside. The patient shares that she had a very good nights rest. Her husband shares that yesterday she was more confused throughout the day, however today she has returned to her baseline. He says that she has baseline dementia and parkinson's. She has difficulty with her short term memory that is better in the morning and becomes worse around 1PM everyday. She was started on Aricept a couple of months ago and he feels her short term memory has improved. He shares that baseline she has vivid hallucinations and delusions that have been going on for several years. Overall today they both feel she is doing much better and has returned to baseline. They are optimistic that they will be able to return home. Her husband shares that he remains uncertain as to why these "unpresoponsive" episodes occurred in July and on this admission.   Exam: Vitals:   11/13/19 0745 11/13/19 1110  BP: 130/77 109/66  Pulse: 60 (!) 57  Resp: 20 18  Temp: 97.7 F (36.5 C) 97.6 F (36.4 C)  SpO2: 99% 98%    Physical Exam  Constitutional: Appears well-developed and thin.   Psych: Affect appropriate to situation Eyes: Normal external eye and conjunctiva. HENT: Normocephalic, no lesions, without obvious abnormality.   Musculoskeletal-no joint tenderness, deformity or swelling Cardiovascular: Normal rate and regular rhythm.  Respiratory: Effort normal, non-labored breathing saturations WNL GI: Soft.  No distension. There is no tenderness.  Skin: WDI   Neuro Exam  Mental Status: Oriented to hospital, but incorrectly identified which hospital. Stated it was Aug 2020 (currently Sept 2021), she was able to correctly state the number of quarters in a dollar, but when asked how many in 2.75 she said 10. She can spell world frontwards and attempted to spell backwards, but mixed the letters.  Speech fluent without evidence of aphasia.  Able to follow 3 step commands without difficulty. Cranial Nerves: II:  Visual fields grossly normal,  III,IV, VI: ptosis not present, extra-ocular motions intact bilaterally pupils equal, round, reactive to light and accommodation V,VII: smile symmetric, facial light touch sensation normal bilaterally VIII: hearing normal bilaterally IX,X: uvula rises symmetrically XI: bilateral shoulder shrug XII: midline tongue extension Motor: Right : Upper extremity   5/5    Left:     Upper extremity   5/5  Lower extremity   5/5     Lower extremity   5/5 Tone and bulk:normal tone throughout; Sensory:  light touch intact throughout, bilaterally Cerebellar: normal finger-to-nose,     Medications: I have reviewed the patient's current medications.  Pertinent Labs/Diagnostics:   EEG  Result Date: 11/12/2019 Lora Havens, MD     11/12/2019  1:43 PM Patient Name: Kellie Moore MRN: [id number] Epilepsy Attending: Lora Havens Referring Physician/Provider: Dr Karena Addison Aroor Date: 11/12/2019 Duration: 33.21 mins Patient history: 69 year old female with Parkinson's disease and dementia presents with altered mental status.  EEG to evaluate for seizure. Level of alertness: Awake AEDs during EEG study: None Technical aspects: This EEG study was done with scalp electrodes positioned according to the 10-20 International system of electrode placement. Electrical activity was acquired at a sampling rate of 500Hz  and reviewed with a high frequency filter of 70Hz  and a low frequency filter of 1Hz . EEG data were recorded  continuously and digitally stored. Description: The posterior dominant rhythm consists of 8-9 Hz activity of moderate voltage (25-35 uV) seen predominantly in posterior head regions, symmetric and reactive to eye opening and eye closing. Hyperventilation and photic stimulation were not performed.   IMPRESSION: This study is within normal limits. No seizures  or epileptiform discharges were seen throughout the recording. Lora Havens   CT HEAD WO CONTRAST  Result Date: 11/11/2019 CLINICAL DATA:  Encephalopathy. History of Parkinson's disease and breast carcinoma. EXAM: CT HEAD WITHOUT CONTRAST TECHNIQUE: Contiguous axial images were obtained from the base of the skull through the vertex without intravenous contrast. COMPARISON:  Head CT 09/23/2019 FINDINGS: Brain: There is no mass, hemorrhage or extra-axial collection. The size and configuration of the ventricles and extra-axial CSF spaces are normal. The brain parenchyma is normal, without acute or chronic infarction. Vascular: No abnormal hyperdensity of the major intracranial arteries or dural venous sinuses. No intracranial atherosclerosis. Skull: The visualized skull base, calvarium and extracranial soft tissues are normal. Sinuses/Orbits: No fluid levels or advanced mucosal thickening of the visualized paranasal sinuses. No mastoid or middle ear effusion. The orbits are normal. IMPRESSION: Normal head CT. Electronically Signed   By: Ulyses Jarred M.D.   On: 11/11/2019 22:27   DG Chest Port 1 View  Result Date: 11/11/2019 CLINICAL DATA:  Altered mental status, catatonic for 2 hours EXAM: PORTABLE CHEST 1 VIEW COMPARISON:  CT 06/19/2017 FINDINGS: Hyperinflation is similar to comparison studies. Few rounded mineralization seen in the right infrahilar lung, could reflect stacked broncholith in a patient with a history of prior mucoid impaction. No other consolidation, features of edema, pneumothorax, or effusion. Pulmonary vascularity is normally distributed. The aorta is calcified. The remaining cardiomediastinal contours are unremarkable. No acute osseous or soft tissue abnormality. Telemetry leads overlie the chest. IMPRESSION: A few rounded mineralization in the right infrahilar lung, could reflect small broncholiths in a patient with a history of prior mucoid impaction. No acute consolidative  opacities. Chronic hyperinflation. Aortic Atherosclerosis (ICD10-I70.0). Electronically Signed   By: Lovena Le M.D.   On: 11/11/2019 17:29   Assessment:  37 female with baseline parkinson's disease and dementia. Presented with altered mental status. Metabolic work-up including urinalysis was negative for infection. Normal Head CT and normal EEG. Today that patient and her husband feel she has returned to baseline. Her husband reports that she normally does well in the morning and becomes more confused around 1PM. She is able to hold a general conversation and follows all commands, but has difficulty with orientation questions, which her husband shares is normal. Overall they feel she has returned to baseline and would like to return home.   Impression: Acute Encephalopathy   Recommendations:  Unclear cause of her acute encephalopathy episode that has now resolved. Possibilities include transient worsening of her baseline dementia/delusions causing delirium. Possibly behavorial. This may also be a partial seizure event, however given no further episodes, normal head CT and no seizure activity on EEG, the neurology service does not recommend starting antiepileptics at this time. Recommend follow-up with Neurology outpatient.     Gwenyth Bouillon DNP, FNP-C  [facility] Nurse Practitioner  Plan of care discussed with Dr. Leonel Ramsay. Note review and attending neurologist recommendations to follow.   11/13/2019, 11:23 AM

## 2019-11-13 NOTE — Progress Notes (Signed)
Nsg Discharge Note  Admit Date:  11/11/2019 Discharge date: 11/13/2019   JALYIAH SHELLEY to be D/C'd Home per MD order.  AVS completed.  Copy for chart, and copy for patient signed, and dated. Patient/caregiver able to verbalize understanding.  Discharge Medication: Allergies as of 11/13/2019      Reactions   Other Itching, Other (See Comments)   Dust and pollen = Itchy eyes and runny nose      Medication List    TAKE these medications   atropine 1 % ophthalmic solution For drooling,these drops can be diluted 81ml in 122ml of water and used as a mouth rinse up to three times a day. You can also try 2 drops under the tongue up to 3 times a day without diluting it if that works better for you What changed:   how much to take  how to take this  when to take this  additional instructions   bisacodyl 5 MG EC tablet Generic drug: bisacodyl Take 5 mg by mouth at bedtime as needed for mild constipation or moderate constipation.   calcium carbonate 750 MG chewable tablet Commonly known as: TUMS EX Chew 1 tablet by mouth as needed for heartburn (or reflux).   carbidopa-levodopa 25-100 MG tablet Commonly known as: SINEMET IR Take 1 tablet three times a day with meals What changed:   how much to take  how to take this  when to take this  additional instructions Notes to patient: This evening, 11/13/19.   donepezil 10 MG tablet Commonly known as: ARICEPT Take 1/2 tablet daily for 2 weeks, then increase to 1 tablet daily What changed:   how much to take  how to take this  when to take this  additional instructions Notes to patient: Tomorrow, 11/14/19.   mirtazapine 15 MG tablet Commonly known as: REMERON TAKE 2 TABLETS BY MOUTH AT BEDTIME What changed:   how much to take  how to take this  when to take this  additional instructions Notes to patient: Tonight, 11/13/19.   oxymetazoline 0.05 % nasal spray Commonly known as: AFRIN Place 1 spray into both nostrils  2 (two) times daily as needed for congestion.   Vicks Sinex Severe Decongest 0.05 % nasal spray Generic drug: oxymetazoline Place 1 spray into both nostrils 2 (two) times daily as needed for congestion.       Discharge Assessment: Vitals:   11/13/19 0745 11/13/19 1110  BP: 130/77 109/66  Pulse: 60 (!) 57  Resp: 20 18  Temp: 97.7 F (36.5 C) 97.6 F (36.4 C)  SpO2: 99% 98%   Skin clean, dry and intact without evidence of skin break down, no evidence of skin tears noted. IV catheter discontinued intact. Site without signs and symptoms of complications - no redness or edema noted at insertion site, patient denies c/o pain - only slight tenderness at site.  Dressing with slight pressure applied.  D/c Instructions-Education: Discharge instructions given to patient/family with verbalized understanding. D/c education completed with patient/family including follow up instructions, medication list, d/c activities limitations if indicated, with other d/c instructions as indicated by MD - patient able to verbalize understanding, all questions fully answered. Patient instructed to return to ED, call 911, or call MD for any changes in condition.  Patient escorted via [place], and D/C home via private auto.  Joni Reining, RN 11/13/2019 3:17 PM

## 2019-11-13 NOTE — TOC Progression Note (Signed)
Transition of Care [facility]) - Progression Note    Patient Details  Name: Kellie Moore MRN: [id number] Date of Birth: January 21, 1951  Transition of Care [facility]) CM/SW Contact  Coralee [place], [place] Phone Number: 11/13/2019, 11:06 AM  Clinical Narrative:    CSW spoke with pt and husband at bedside to discuss options for after discharge. Pt and spouse agree that they would not like to pursue SNF. They agreed on returning home and continuing [place] [place] with [facility]. They have a rollator at home, and husband states pt is back at baseline. [facility] contacted and able to continue providing services.   Expected Discharge Plan: [place] Barriers to Discharge: Continued Medical Work up  Expected Discharge Plan and Services Expected Discharge Plan: [place] arrangements for the past 2 months: Single Family Home                                       Social Determinants of Health (SDOH) Interventions    Readmission Risk Interventions No flowsheet data found.

## 2019-11-13 NOTE — TOC Transition Note (Signed)
Transition of Care [facility]) - CM/SW Discharge Note   Patient Details  Name: Kellie Moore MRN: [id number] Date of Birth: [birth year]  Transition of Care [facility]) CM/SW Contact:  Coralee Pesa, [place] Phone Number: 11/13/2019, 2:08 PM   Clinical Narrative:    Pt to discharge home with [facility]. [facility] contacted, no further needs.   Final next level of care: [place] Barriers to Discharge: Barriers Resolved   Patient Goals and CMS Choice Patient states their goals for this hospitalization and ongoing recovery are:: Return home      Discharge Placement                  Name of family member notified: Jlyn Cerros Patient and family notified of of transfer: 11/13/19  Discharge Plan and Services                          HH Arranged: PT, OT, Speech Therapy [place] Agency: [facility] Date [place]: 11/13/19 Time [place]: [id number] Representative spoke with at [place]: [place] ([place]) Interventions     Readmission Risk Interventions No flowsheet data found.

## 2019-11-13 NOTE — Plan of Care (Signed)
  Problem: Education: Goal: Knowledge of General Education information will improve Description: Including pain rating scale, medication(s)/side effects and non-pharmacologic comfort measures 11/13/2019 1515 by Joni Reining, RN Outcome: Adequate for Discharge 11/13/2019 1513 by Joni Reining, RN Outcome: Progressing   Problem: Health Behavior/Discharge Planning: Goal: Ability to manage health-related needs will improve 11/13/2019 1515 by Joni Reining, RN Outcome: Adequate for Discharge 11/13/2019 1513 by Joni Reining, RN Outcome: Progressing   Problem: Clinical Measurements: Goal: Ability to maintain clinical measurements within normal limits will improve Outcome: Adequate for Discharge Goal: Will remain free from infection Outcome: Adequate for Discharge Goal: Diagnostic test results will improve Outcome: Adequate for Discharge Goal: Respiratory complications will improve Outcome: Adequate for Discharge Goal: Cardiovascular complication will be avoided Outcome: Adequate for Discharge   Problem: Activity: Goal: Risk for activity intolerance will decrease 11/13/2019 1515 by Joni Reining, RN Outcome: Adequate for Discharge 11/13/2019 1513 by Joni Reining, RN Outcome: Progressing   Problem: Nutrition: Goal: Adequate nutrition will be maintained Outcome: Adequate for Discharge   Problem: Coping: Goal: Level of anxiety will decrease Outcome: Adequate for Discharge   Problem: Elimination: Goal: Will not experience complications related to bowel motility Outcome: Adequate for Discharge Goal: Will not experience complications related to urinary retention Outcome: Adequate for Discharge   Problem: Pain Managment: Goal: General experience of comfort will improve Outcome: Adequate for Discharge   Problem: Safety: Goal: Ability to remain free from injury will improve Outcome: Adequate for Discharge   Problem: Skin Integrity: Goal: Risk for impaired skin integrity  will decrease Outcome: Adequate for Discharge

## 2019-11-13 NOTE — Evaluation (Signed)
Clinical/Bedside Swallow Evaluation Patient Details  Name: Kellie Moore MRN: [id number] Date of Birth: Oct 19, 1950  Today's Date: 11/13/2019 Time: SLP Start Time (ACUTE ONLY): 0945 SLP Stop Time (ACUTE ONLY): 1000 SLP Time Calculation (min) (ACUTE ONLY): 15 min  Past Medical History:  Past Medical History:  Diagnosis Date  . Cancer [facility])    Breast cancer  . Parkinson's disease ([place])   . Parkinson's disease dementia [facility])    Past Surgical History:  Past Surgical History:  Procedure Laterality Date  . BREAST LUMPECTOMY Right    radiation   HPI:  Kellie Moore is a 69 y.o. female with history of Parkinson's disease, dementia, who presents with altered mental status. Pt's husband reports that she became unresponsive and confused, so he called EMS. On further history he reports that she is sometimes unresponsive but then it will improve later times in the day. Head CT and labwork normal. 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 Pt was admitted 09/24/19 with similar presentation. Her mental status returned to baseline spontaneously the day after admission and she was discharged home. OP MBS performed 09/20/19 revealed mild oropharyngeal dysphagia with sensorimotor deficits due to her Parkinson's disease. Pt demonstrated silent aspiraton with larger sequential swallows of nectar with chin tuck. Pt receiving OP swallowing therapy for swallowing precautions (chin tuck, dys 3/thin, pills in applesauce, small sips/bites). OP SLP reports waxing/waning of swallow function throughout the day and educated may need to downgrade to puree and liquids at night. Pt recommended to f/u with home health.    Assessment / Plan / Recommendation Clinical Impression  Pt was awake and alert during BSE. Her baseline vocal quality is hypophonic with a rapid rate of speech and low vocal intensity. Her husband shared that she is receiving SLP therapy at home to address dysphagia and  concern of silent aspiration. He reports that they follow a dys 3 diet and often purees with thin liquids (via cup, no straw) at home. He also reports the use of a chin tuck, single small bites/sips, and provides reminders to clear her throat. Given sundowning from her dementia, they avoid meals later in the evening. Pt demonstrated a functional swallow at the bedside without any overt s/sx of aspiration with thin liquids and puree. She demonstrated complete chin tuck with both consistencies with verbal cueing. Educated about strategies to address concern for silent aspiration. Recommend they continue with dys 3 diet and utilization of compensatory strategies. Continue home health SLP treatment.  SLP Visit Diagnosis: Dysphagia, oropharyngeal phase (R13.12)    Aspiration Risk  Mild aspiration risk    Diet Recommendation Dysphagia 3 (Mech soft);Thin liquid   Liquid Administration via: Cup Medication Administration: Whole meds with puree Supervision: Patient able to self feed;Intermittent supervision to cue for compensatory strategies Compensations: Slow rate;Small sips/bites;Clear throat intermittently;Chin tuck Postural Changes: Seated upright at 90 degrees    Other  Recommendations Oral Care Recommendations: Oral care BID   Follow up Recommendations Home health SLP      Frequency and Duration min 2x/week  2 weeks       Prognosis Prognosis for Safe Diet Advancement: Fair Barriers to Reach Goals: Cognitive deficits Barriers/Prognosis Comment:  (pt has dementia and Parkinson's)      Swallow Study   General Date of Onset: 11/11/19 HPI: Kellie Moore is a 69 y.o. female with history of Parkinson's disease, dementia, who presents with altered mental status. Pt's husband reports that she became unresponsive and confused, so  he called EMS. On further history he reports that she is sometimes unresponsive but then it will improve later times in the day. Head CT and labwork normal. 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 Pt was admitted 09/24/19 with similar presentation. Her mental status returned to baseline spontaneously the day after admission and she was discharged home. OP MBS performed 09/20/19 revealed mild oropharyngeal dysphagia with sensorimotor deficits due to her Parkinson's disease. Pt demonstrated silent aspiraton with larger sequential swallows of nectar with chin tuck. Pt receiving OP swallowing therapy for swallowing precautions (chin tuck, dys 3/thin, pills in applesauce, small sips/bites). OP SLP reports waxing/waning of swallow function throughout the day and educated may need to downgrade to puree and liquids at night. Pt recommended to f/u with home health.  Type of Study: Bedside Swallow Evaluation Previous Swallow Assessment: OP MBS completed 09/20/19 revealed oropharyngeal dysphagia and silent aspiration of thickened liquids. Diet Prior to this Study: Regular;Thin liquids Temperature Spikes Noted: No Respiratory Status: Room air History of Recent Intubation: No Behavior/Cognition: Alert;Cooperative;Pleasant mood Oral Cavity Assessment: Within Functional Limits Oral Care Completed by SLP: No Oral Cavity - Dentition: Adequate natural dentition Vision: Functional for self-feeding Self-Feeding Abilities: Able to feed self Patient Positioning: Upright in bed Baseline Vocal Quality: Low vocal intensity (hypophonic, rapid rate of speech)    Oral/Motor/Sensory Function Overall Oral Motor/Sensory Function: Within functional limits   Ice Chips Ice chips: Within functional limits Presentation: Spoon   Thin Liquid Thin Liquid: Within functional limits Presentation: Cup;Self Fed    Nectar Thick     Honey Thick     Puree Puree: Within functional limits Presentation: Self Fed;Spoon   Solid            Greggory Keen 11/13/2019,10:46 AM

## 2019-11-13 NOTE — Evaluation (Signed)
Occupational Therapy Evaluation Patient Details Name: Kellie Moore MRN: [id number] DOB: 07-28-1950 Today's Date: 11/13/2019    History of Present Illness Pt is a 69 y.o. female with PMH of parkinson's disease, dementia, and breast cancer presenting to ER after pt became less responsive with difficulty speaking. CT of head and EEG is unremarkable for infarct or seizure.   Clinical Impression   Patient evaluated by Occupational Therapy with no further acute OT needs identified. All education has been completed and the patient has no further questions. See below for any follow-up Occupational Therapy or equipment needs. OT to sign off. Thank you for referral.      Follow Up Recommendations  Home health OT;Supervision/Assistance - 24 hour    Equipment Recommendations  None recommended by OT    Recommendations for Other Services       Precautions / Restrictions Precautions Precautions: Fall Precaution Comments: A&O x1 self and spouse Restrictions Weight Bearing Restrictions: No      Mobility Bed Mobility Overal bed mobility: Needs Assistance Bed Mobility: Supine to Sit;Sit to Supine     Supine to sit: Min assist Sit to supine: Min assist   General bed mobility comments: spouse (A) and reports this is baseline. spouse reports buying pads for bed   Transfers Overall transfer level: Needs assistance Equipment used: 1 person hand held assist Transfers: Sit to/from Stand Sit to Stand: Min assist              Balance Overall balance assessment: Needs assistance Sitting-balance support: Bilateral upper extremity supported;Feet supported Sitting balance-Leahy Scale: Fair                                     ADL either performed or assessed with clinical judgement   ADL Overall ADL's : Needs assistance/impaired     Grooming: Set up;Wash/dry hands;Standing                   Toilet Transfer: Minimal assistance;Ambulation;Regular Contractor Details (indicate cue type and reason): spouse (A)  Toileting- Clothing Manipulation and Hygiene: Minimal assistance;Sit to/from stand Toileting - Clothing Manipulation Details (indicate cue type and reason): spouse (A)      Functional mobility during ADLs: Minimal assistance       Vision Baseline Vision/History: Wears glasses Wears Glasses: At all times       Perception     Praxis      Pertinent Vitals/Pain Pain Assessment: No/denies pain     Hand Dominance Right   Extremity/Trunk Assessment Upper Extremity Assessment Upper Extremity Assessment: Generalized weakness   Lower Extremity Assessment Lower Extremity Assessment: Defer to PT evaluation   Cervical / Trunk Assessment Cervical / Trunk Assessment: Kyphotic   Communication Communication Communication: Expressive difficulties   Cognition Arousal/Alertness: Awake/alert   Overall Cognitive Status: History of cognitive impairments - at baseline Area of Impairment: Attention;Memory;Safety/judgement;Problem solving;Awareness;Orientation                 Orientation Level: Disoriented to;Place;Time;Situation Current Attention Level: Sustained     Safety/Judgement: Decreased awareness of safety   Problem Solving: Difficulty sequencing;Requires verbal cues;Requires tactile cues     General Comments       Exercises     Shoulder Instructions      Home Living Family/patient expects to be discharged to:: Private residence Living Arrangements: Spouse/significant other Available Help at Discharge: Family Type of Home: House Home Access:  Stairs to enter [employer] of Steps: 5   Home Layout: Two level Alternate Level Stairs-Number of Steps: unknown   Bathroom Shower/Tub: Teacher, early years/pre: Standard     Home Equipment: Environmental consultant - 2 wheels;Bedside commode;Tub bench;Walker - 4 wheels   Additional Comments: husband and children help care for pt  Lives With:  Spouse    Prior Functioning/Environment Level of Independence: Needs assistance  Gait / Transfers Assistance Needed: Husband A, access to new rollator  ADL's / Homemaking Assistance Needed: assisted with family- spouse helps with transfers            OT Problem List:        OT Treatment/Interventions: Self-care/ADL training;Therapeutic exercise;Energy conservation;DME and/or AE instruction;Therapeutic activities;Cognitive remediation/compensation;Patient/family education;Balance training    OT Goals(Current goals can be found in the care plan section) Acute Rehab OT Goals Patient Stated Goal: to return home OT Goal Formulation: With patient Potential to Achieve Goals: Good  OT Frequency:     Barriers to D/C:            Co-evaluation              AM-PAC OT "6 Clicks" Daily Activity     Outcome Measure Help from another person eating meals?: None Help from another person taking care of personal grooming?: A Little Help from another person toileting, which includes using toliet, bedpan, or urinal?: A Little Help from another person bathing (including washing, rinsing, drying)?: A Little Help from another person to put on and taking off regular upper body clothing?: A Little Help from another person to put on and taking off regular lower body clothing?: A Little 6 Click Score: 19   End of Session Nurse Communication: Mobility status;Precautions  Activity Tolerance: Patient tolerated treatment well Patient left: in bed;with call bell/phone within reach;with family/visitor present  OT Visit Diagnosis: Unsteadiness on feet (R26.81)                Time: 0174-9449 OT Time Calculation (min): 13 min Charges:  OT General Charges $OT Visit: 1 Visit OT Evaluation $OT Eval Low Complexity: 1 Low   Brynn, OTR/L  Acute Rehabilitation Services Pager: [phone] Office: [phone] .   Jeri Modena 11/13/2019, 1:08 PM

## 2019-11-13 NOTE — Plan of Care (Signed)
  Problem: Education: Goal: Knowledge of General Education information will improve Description: Including pain rating scale, medication(s)/side effects and non-pharmacologic comfort measures Outcome: Progressing   Problem: Health Behavior/Discharge Planning: Goal: Ability to manage health-related needs will improve Outcome: Progressing   Problem: Activity: Goal: Risk for activity intolerance will decrease Outcome: Progressing  Patient alert and conversant, no seizures observed this shift. Patient able to stand to get dressed and feed herself with assistance and supervision.

## 2019-11-15 ENCOUNTER — Encounter: Payer: PPO | Admitting: Occupational Therapy

## 2019-11-15 ENCOUNTER — Ambulatory Visit: Payer: PPO | Admitting: Physical Therapy

## 2019-11-16 DIAGNOSIS — Z853 Personal history of malignant neoplasm of breast: Secondary | ICD-10-CM | POA: Diagnosis not present

## 2019-11-16 DIAGNOSIS — Z8673 Personal history of transient ischemic attack (TIA), and cerebral infarction without residual deficits: Secondary | ICD-10-CM | POA: Diagnosis not present

## 2019-11-16 DIAGNOSIS — Z9181 History of falling: Secondary | ICD-10-CM | POA: Diagnosis not present

## 2019-11-16 DIAGNOSIS — F0280 Dementia in other diseases classified elsewhere without behavioral disturbance: Secondary | ICD-10-CM | POA: Diagnosis not present

## 2019-11-16 DIAGNOSIS — R1312 Dysphagia, oropharyngeal phase: Secondary | ICD-10-CM | POA: Diagnosis not present

## 2019-11-16 DIAGNOSIS — F419 Anxiety disorder, unspecified: Secondary | ICD-10-CM | POA: Diagnosis not present

## 2019-11-16 DIAGNOSIS — D649 Anemia, unspecified: Secondary | ICD-10-CM | POA: Diagnosis not present

## 2019-11-16 DIAGNOSIS — Z79899 Other long term (current) drug therapy: Secondary | ICD-10-CM | POA: Diagnosis not present

## 2019-11-16 DIAGNOSIS — F329 Major depressive disorder, single episode, unspecified: Secondary | ICD-10-CM | POA: Diagnosis not present

## 2019-11-16 DIAGNOSIS — G20 Parkinson's disease: Secondary | ICD-10-CM | POA: Diagnosis not present

## 2019-11-18 ENCOUNTER — Encounter: Payer: PPO | Admitting: Occupational Therapy

## 2019-11-18 ENCOUNTER — Ambulatory Visit: Payer: PPO | Admitting: Physical Therapy

## 2019-11-18 DIAGNOSIS — R32 Unspecified urinary incontinence: Secondary | ICD-10-CM | POA: Diagnosis not present

## 2019-11-18 DIAGNOSIS — Z23 Encounter for immunization: Secondary | ICD-10-CM | POA: Diagnosis not present

## 2019-11-18 DIAGNOSIS — G20 Parkinson's disease: Secondary | ICD-10-CM | POA: Diagnosis not present

## 2019-11-18 DIAGNOSIS — G9340 Encephalopathy, unspecified: Secondary | ICD-10-CM | POA: Diagnosis not present

## 2019-11-18 DIAGNOSIS — R269 Unspecified abnormalities of gait and mobility: Secondary | ICD-10-CM | POA: Diagnosis not present

## 2019-11-18 DIAGNOSIS — E43 Unspecified severe protein-calorie malnutrition: Secondary | ICD-10-CM | POA: Diagnosis not present

## 2019-11-22 ENCOUNTER — Ambulatory Visit: Payer: PPO | Admitting: Physical Therapy

## 2019-11-22 ENCOUNTER — Encounter: Payer: PPO | Admitting: Occupational Therapy

## 2019-11-25 ENCOUNTER — Encounter: Payer: PPO | Admitting: Occupational Therapy

## 2019-11-25 ENCOUNTER — Ambulatory Visit: Payer: PPO | Admitting: Physical Therapy

## 2019-11-28 ENCOUNTER — Ambulatory Visit: Payer: PPO | Admitting: Physical Therapy

## 2019-12-03 DIAGNOSIS — F0280 Dementia in other diseases classified elsewhere without behavioral disturbance: Secondary | ICD-10-CM | POA: Diagnosis not present

## 2019-12-03 DIAGNOSIS — Z741 Need for assistance with personal care: Secondary | ICD-10-CM | POA: Diagnosis not present

## 2019-12-03 DIAGNOSIS — G20 Parkinson's disease: Secondary | ICD-10-CM | POA: Diagnosis not present

## 2019-12-06 DIAGNOSIS — R1312 Dysphagia, oropharyngeal phase: Secondary | ICD-10-CM | POA: Diagnosis not present

## 2019-12-06 DIAGNOSIS — F419 Anxiety disorder, unspecified: Secondary | ICD-10-CM | POA: Diagnosis not present

## 2019-12-06 DIAGNOSIS — D649 Anemia, unspecified: Secondary | ICD-10-CM | POA: Diagnosis not present

## 2019-12-06 DIAGNOSIS — Z853 Personal history of malignant neoplasm of breast: Secondary | ICD-10-CM | POA: Diagnosis not present

## 2019-12-06 DIAGNOSIS — Z9181 History of falling: Secondary | ICD-10-CM | POA: Diagnosis not present

## 2019-12-06 DIAGNOSIS — F329 Major depressive disorder, single episode, unspecified: Secondary | ICD-10-CM | POA: Diagnosis not present

## 2019-12-06 DIAGNOSIS — R531 Weakness: Secondary | ICD-10-CM | POA: Diagnosis not present

## 2019-12-06 DIAGNOSIS — Z79899 Other long term (current) drug therapy: Secondary | ICD-10-CM | POA: Diagnosis not present

## 2019-12-06 DIAGNOSIS — R269 Unspecified abnormalities of gait and mobility: Secondary | ICD-10-CM | POA: Diagnosis not present

## 2019-12-06 DIAGNOSIS — G20 Parkinson's disease: Secondary | ICD-10-CM | POA: Diagnosis not present

## 2019-12-06 DIAGNOSIS — E43 Unspecified severe protein-calorie malnutrition: Secondary | ICD-10-CM | POA: Diagnosis not present

## 2019-12-06 DIAGNOSIS — Z8673 Personal history of transient ischemic attack (TIA), and cerebral infarction without residual deficits: Secondary | ICD-10-CM | POA: Diagnosis not present

## 2019-12-06 DIAGNOSIS — F0280 Dementia in other diseases classified elsewhere without behavioral disturbance: Secondary | ICD-10-CM | POA: Diagnosis not present

## 2019-12-09 DIAGNOSIS — Z853 Personal history of malignant neoplasm of breast: Secondary | ICD-10-CM | POA: Diagnosis not present

## 2019-12-09 DIAGNOSIS — G20 Parkinson's disease: Secondary | ICD-10-CM | POA: Diagnosis not present

## 2019-12-09 DIAGNOSIS — Z9181 History of falling: Secondary | ICD-10-CM | POA: Diagnosis not present

## 2019-12-09 DIAGNOSIS — R1312 Dysphagia, oropharyngeal phase: Secondary | ICD-10-CM | POA: Diagnosis not present

## 2019-12-09 DIAGNOSIS — F0280 Dementia in other diseases classified elsewhere without behavioral disturbance: Secondary | ICD-10-CM | POA: Diagnosis not present

## 2019-12-09 DIAGNOSIS — D649 Anemia, unspecified: Secondary | ICD-10-CM | POA: Diagnosis not present

## 2019-12-09 DIAGNOSIS — Z8673 Personal history of transient ischemic attack (TIA), and cerebral infarction without residual deficits: Secondary | ICD-10-CM | POA: Diagnosis not present

## 2019-12-09 DIAGNOSIS — F329 Major depressive disorder, single episode, unspecified: Secondary | ICD-10-CM | POA: Diagnosis not present

## 2019-12-09 DIAGNOSIS — F419 Anxiety disorder, unspecified: Secondary | ICD-10-CM | POA: Diagnosis not present

## 2019-12-09 DIAGNOSIS — Z79899 Other long term (current) drug therapy: Secondary | ICD-10-CM | POA: Diagnosis not present

## 2019-12-11 DIAGNOSIS — R269 Unspecified abnormalities of gait and mobility: Secondary | ICD-10-CM | POA: Diagnosis not present

## 2019-12-11 DIAGNOSIS — G20 Parkinson's disease: Secondary | ICD-10-CM | POA: Diagnosis not present

## 2019-12-13 ENCOUNTER — Telehealth: Payer: Self-pay | Admitting: Neurology

## 2019-12-13 ENCOUNTER — Telehealth: Payer: Self-pay

## 2019-12-13 NOTE — Telephone Encounter (Signed)
Pt husband called in stated that his wife was very weak that she could not stand up when they tried she would fall, he was advised that he should take her to the ER he said that it would be a waste of time, pt told that with the changes she needs to go, talk to Dr Delice Lesch she agrees, she advised that if he did not want to take her to the ER for him to increase her fluids and food intake and if she get any worse for him to take her to the ER, he stated that if she gets any worse that he will take her the to the ER.

## 2019-12-13 NOTE — Telephone Encounter (Signed)
Patient husband wants to speak to someone about his wife. He states that she collapsed at 6 am this morning and was very weak. He got her to eat some breakfast and she is feeling a little better. I talked with Nira Conn and she said they needed to got the ED but the patient does not want to go since she is feeling a little better. Husband would like a call back

## 2019-12-13 NOTE — Telephone Encounter (Signed)
Scheduled for clinic f/u on 12/15/19.

## 2019-12-13 NOTE — Telephone Encounter (Signed)
Spoke with Mr Furth at [address] was in the kitchen in the dark he turned the light on asked her what she was doing he went to her she collapsed in his arms NO LOC for 20 sec she could not move he lowered her to the floor. After the event she ate a little and said she felt a little better she is now in the bed sleeping, Mr [place] stated her BP and pulse and breathing are fine, yesterday she did not eat well at all asking if that could of caused it,

## 2019-12-16 ENCOUNTER — Encounter: Payer: Self-pay | Admitting: Neurology

## 2019-12-16 ENCOUNTER — Other Ambulatory Visit: Payer: Self-pay

## 2019-12-16 ENCOUNTER — Ambulatory Visit: Payer: PPO | Admitting: Neurology

## 2019-12-16 VITALS — BP 90/58 | HR 77 | Ht 67.0 in | Wt 91.6 lb

## 2019-12-16 DIAGNOSIS — R4182 Altered mental status, unspecified: Secondary | ICD-10-CM

## 2019-12-16 DIAGNOSIS — F0281 Dementia in other diseases classified elsewhere with behavioral disturbance: Secondary | ICD-10-CM | POA: Diagnosis not present

## 2019-12-16 DIAGNOSIS — G3183 Dementia with Lewy bodies: Secondary | ICD-10-CM | POA: Diagnosis not present

## 2019-12-16 MED ORDER — ABDOMINAL BINDER/ELASTIC SMALL MISC: 0 refills | Status: DC

## 2019-12-16 MED ORDER — ABDOMINAL BINDER/ELASTIC SMALL MISC
0 refills | Status: AC
Start: 2019-12-16 — End: ?

## 2019-12-16 MED ORDER — TRAZODONE HCL 50 MG PO TABS: ORAL_TABLET | ORAL | 11 refills | Status: DC

## 2019-12-16 NOTE — Progress Notes (Signed)
NEUROLOGY FOLLOW UP OFFICE NOTE  Kellie Moore [id number] [birth year]  HISTORY OF PRESENT ILLNESS: I had the pleasure of seeing Kellie Moore in follow-up in the neurology clinic on 12/16/2019.  The patient was last seen 4 months ago. She is again accompanied by her husband who helps supplement the history today. Since her last visit, she has been admitted to the hospital twice for episodes of unresponsiveness Records were reviewed. On 7/26, her husband found her unresponsive, not following commands. She was evaluated by Neurology with an NIHSS of 13, non-focal, felt due to underlying neurodegenerative disease. She significantly improved the next day, symptoms felt due to cognitive fluctuation from PD. Her EEG was normal. MRI brain no acute changes, with mildly advanced diffuse atrophy. She was discharged with home health. She was back in the hospital on 9/13 again unresponsive. Repeat EEG normal, head CT unremarkable. Her husband talked to other people and felt that these episodes were due to the atropine drops he puts under her tongue for sialorrhea. He stopped if last month. She had another milder episode 3 days ago. She did fine with physical therapy that morning, she did PT exercise and walked around and was told she did not need PT any longer. Three hours later, she took her walker then had a funny look on her face. Her hands were shaky, her head went down and she slumped and collapsed on the floor. He stretched her out, checked her pulse and breathing, then she looked at him and asked what was going on. He stood her up then her legs got so weak she could not stand up. When he got her up, BP was 90/60. On average she runs 120/70. She takes Midodrine BID (breakfast and lunch). She has the funny look on her face 2-3 times a day, even when supine. Her husband has several concerns. Her drooling has become significantly worse, "like molasses coming out." The sundowning, hallucinations and delusions have  increased significantly, she thinks her parents are with her and there are 3 of her husband. She wants to go home every night. She confuses dreams with reality, no REM behavior disorder. She is beginning to have bladder incontinence and wears Depends. She complains of chills all day even if it is 74 degrees at home. She has sleep difficulties, he gave her a left over Valium 3 months ago and she slept like a baby. She is on mirtazapine 30mg  qhs, Sinemet 25/100mg  TID, and Donepezil 10mg  daily. On good days, she walks up and down the house really well. Her sense of humor is still pretty good.    History on Initial Assessment 08/09/2017: This is a pleasant 69 yo RH woman with a history of breast cancer s/p lumpectomy and radiation, in her usual state of health until 2 years ago when she started having changes in memory and personality. She feels her memory is pretty good, she has noticed some changes every once in a while with trouble getting her words out. Her husband has noticed similar changes, she would say the wrong word. He has also noted a change in behavior with significant anxiety. He feels changes may have started 10 years ago when she was diagnosed with possible breast cancer. Since then she has been hypervigilant about her health. She changed her diet and stopped eating red meat and sugar. She has lost 40 lbs in the past 10 years. She states that her mother had 3 bouts of breast cancer, up to 10 years apart. She  has no prior history of significant anxiety, but now feels like she will climb out her skin. She feels like "the bodysnatchers got me, it's not me." She had a prescription for Xanax but has not taken it. Her husband reports that she used to be very active with several projects ongoing at the same time, but in the past 2 years, she has become more lethargic, just wanting to lay around the house. She used to exercise on the treadmill regularly. She states that she has significant fatigue. She used to  cook a lot, but now her husband does the cooking. They share bill responsibilities, no missed bills but her husband reports she would now pay at the last moment, which is not like her. He states she drives him crazy with questions all the time on a day to day basis. No driving concerns, but she does not feel comfortable, especially when her hand shakes. She and her husband started noticing hand tremors over the past 2 years, she feels they are mild and intermittent, he has noticed occasional times where tremor is more pronounced. She feels the shaking is inside. It does not affect eating, but her handwriting has changed. Her husband reports she used to have big, beautiful handwriting, now she has to concentrate to write and it has become smaller. She has also noticed drooling on the left side of her mouth, which is very annoying for her. Sleep feels her sleep is good, no REM behavior disorder, but her husband reports loud snoring and apnea.  She has occasional slight tingling in her right hand. No headaches, dizziness, diplopia, dysarthria/dysphagia, neck/back pain, bowel/bladder dysfunction. No changes in gait or frequent falls. She feels all this first happened when she broke out in hives, she was tested for allergies and found allergic to dust mite and mold. They found mold in their house around a year ago and had the flooring changed, so husband reports it is not an issue any longer. Her maternal uncle had tremors. She states her father had a cerebral hemorrhage, but her husband also reports that her father had similar lethargy and had an MRI which did not find anything, but on autopsy was found to have a brain tumor. She denies any significant head injuries. She occasionally drinks alcohol, and gets the "[employer]" when she drinks.  Laboratory Data: TSH and B12 were done at PCP office, results unavailable for review MRI brain with and without contrast done 08/15/17 did not show any acute changes.  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 Cancer [facility])    Breast cancer  . Parkinson's disease ([place])   . Parkinson's disease dementia [facility])     MEDICATIONS: Current Outpatient Medications on File Prior to Visit  Medication Sig Dispense Refill  . bisacodyl (BISACODYL) 5 MG EC tablet Take 5 mg by mouth at bedtime as needed for mild constipation or moderate constipation.    . calcium carbonate (TUMS EX) 750 MG chewable tablet Chew 1 tablet by mouth as needed for heartburn (or reflux).    . carbidopa-levodopa (SINEMET IR) 25-100 MG tablet Take 1 tablet three times a day with meals (Patient taking differently: Take 1 tablet by mouth See admin instructions. Take 1 tablet by mouth three times a day with meals- 9:30 AM, 1 PM, and 6 PM) 90 tablet 11  . Cyanocobalamin (VITAMIN B 12 PO) Take  by mouth.    . donepezil (ARICEPT) 10 MG tablet Take 1/2 tablet daily for 2 weeks, then increase to 1 tablet daily (Patient taking differently: Take 10 mg by mouth in the morning. ) 30 tablet 11  . midodrine (PROAMATINE) 5 MG tablet     . mirtazapine (REMERON) 15 MG tablet TAKE 2 TABLETS BY MOUTH AT BEDTIME (Patient taking differently: Take 30 mg by mouth at bedtime. ) 180 tablet 3  . oxymetazoline (AFRIN) 0.05 % nasal spray Place 1 spray into both nostrils 2 (two) times daily as needed for congestion.    Marland Kitchen oxymetazoline (VICKS SINEX SEVERE DECONGEST) 0.05 % nasal spray Place 1 spray into both nostrils 2 (two) times daily as needed for congestion.     No current facility-administered medications on file prior to visit.    ALLERGIES: Allergies  Allergen Reactions  . Other Itching and Other (See Comments)    Dust and pollen = Itchy eyes and runny nose    FAMILY HISTORY: Family History  Problem Relation Age of Onset  . Brain cancer Father     SOCIAL HISTORY: Social History   Socioeconomic  History  . Marital status: Married    Spouse name: Not on file  . Number of children: Not on file  . Years of education: Not on file  . Highest education level: Not on file  Occupational History  . Occupation: retired  Tobacco Use  . Smoking status: Never Smoker  . Smokeless tobacco: Never Used  Vaping Use  . Vaping Use: Never used  Substance and Sexual Activity  . Alcohol use: Not Currently    Comment: margarita's on fridays  . Drug use: Never  . Sexual activity: Not on file  Other Topics Concern  . Not on file  Social History Narrative   Lives with husband two story home      Right handed      Masters degree      Retired from [employer] and then [employer] at [employer] of [employer]:   . Difficulty of Paying Living Expenses: Not on file  Food Insecurity:   . Worried About Charity fundraiser in the Last Year: Not on file  . Ran Out of Food in the Last Year: Not on file  Transportation Needs:   . Lack of Transportation (Medical): Not on file  . Lack of Transportation (Non-Medical): Not on file  Physical Activity:   . Days of Exercise per Week: Not on file  . Minutes of Exercise per Session: Not on file  Stress:   . Feeling of Stress : Not on file  Social Connections:   . Frequency of Communication with Friends and Family: Not on file  . Frequency of Social Gatherings with Friends and Family: Not on file  . Attends Religious Services: Not on file  . Active Member of Clubs or Organizations: Not on file  . Attends Archivist Meetings: Not on file  . Marital Status: Not on file  Intimate Partner Violence:   . Fear of Current or Ex-Partner: Not on file  . Emotionally Abused: Not on file  . Physically Abused: Not on file  . Sexually Abused: Not on file     PHYSICAL EXAM: Vitals:   12/16/19 1433  BP: (!) 90/58  Pulse: 77  SpO2: 100%   General: No acute distress, hypomimia/masked facies, significant  sialorrhea Head:  Normocephalic/atraumatic Skin/Extremities: No rash, no edema Neurological Exam:  alert and awake. No aphasia or dysarthria. Fund of knowledge is reduced.  Recent and remote memory are impaired.  Attention and concentration are normal.   Cranial nerves: Pupils equal, round. Extraocular movements intact with no nystagmus. Visual fields full.  No facial asymmetry.  Motor: +cogwheeling bilaterally, muscle strength 5/5 throughout with no pronator drift.   Finger to nose testing intact.  Gait decreased arm swing on left with good strides, narrow-based, no ataxia or shuffling gait. +postural instability. No tremors today.    IMPRESSION: This is a pleasant 69 yo RH woman with a history of breast cancer s/p lumpectomy and radiation, who presented with a 2 year history of tremors, cognitive, and personality changes. Her DATscan in 2019 showed decreased striatal activity on left and right in a pattern consistent with Parkinson's syndrome pathology, greatest deficit in the posterior right putamen. Since her last visit, she has had 2 episodes of unresponsiveness. She is having more hallucinations and delusions. We discussed likely diagnosis of Lewy Body dementia with fluctuating cognition, cognitive deficits, hallucinations. Her husband has not noticed much change with Sinemet, stop medication. She is having more sleep difficulties and will try Trazodone 50mg  1/2 tab qhs x 1 week, then increase to 1 tab qhs. Side effects discussed. Wean off mirtazapine. She has significant sialorrhea despite atropine drops and will be referred to Dr. Carles Collet for Botox. Her husband reports a "funny look" and episodes of weakness and falling, likely due to orthostatic hypotension, however he reports events while supine as well. A 48-hour EEG will be done to classify these events. She was advised to start using an abdominal binder. She continues to have significant anxiety and will try to establish care with Psychiatry.  Follow-up in 3 months, they know to call for any changes.    Thank you for allowing me to participate in her care.  Please do not hesitate to call for any questions or concerns.   Ellouise Newer, M.D.   CC: Dr. Lysle Rubens

## 2019-12-16 NOTE — Patient Instructions (Addendum)
1. Start Trazodone 50mg : Take 1/2 tablet every night for 1 week, then increase to 1 tablet every night  2. Reduce mirtazapine to 1 tab every night for 1 week, then stop  3. Stop carbidopa  4. We will get you set up for Botox for drooling  5. Schedule 48-hour EEG  6. Start using an abdominal binder  7. Contact [facility] and ask if they are taking new patients/your insurance, and let us know if referral is needed  8. Follow-up in 3 months, call for any changes

## 2019-12-23 ENCOUNTER — Encounter: Payer: Self-pay | Admitting: Neurology

## 2019-12-23 NOTE — Progress Notes (Addendum)
Submitted BV through [facility] for patient's Myobloc. SP is [facility] [phone]. Called to set up account and give verbal script.   10/26- received fax for PA forms to fill out. Completed and faxed back same day.

## 2019-12-24 DIAGNOSIS — G20 Parkinson's disease: Secondary | ICD-10-CM | POA: Diagnosis not present

## 2019-12-24 DIAGNOSIS — I951 Orthostatic hypotension: Secondary | ICD-10-CM | POA: Diagnosis not present

## 2019-12-24 DIAGNOSIS — R269 Unspecified abnormalities of gait and mobility: Secondary | ICD-10-CM | POA: Diagnosis not present

## 2019-12-24 DIAGNOSIS — Z853 Personal history of malignant neoplasm of breast: Secondary | ICD-10-CM | POA: Diagnosis not present

## 2019-12-24 DIAGNOSIS — E43 Unspecified severe protein-calorie malnutrition: Secondary | ICD-10-CM | POA: Diagnosis not present

## 2019-12-24 DIAGNOSIS — K117 Disturbances of salivary secretion: Secondary | ICD-10-CM | POA: Diagnosis not present

## 2019-12-26 ENCOUNTER — Telehealth: Payer: Self-pay | Admitting: Neurology

## 2019-12-26 DIAGNOSIS — R1312 Dysphagia, oropharyngeal phase: Secondary | ICD-10-CM | POA: Diagnosis not present

## 2019-12-26 DIAGNOSIS — Z79899 Other long term (current) drug therapy: Secondary | ICD-10-CM | POA: Diagnosis not present

## 2019-12-26 DIAGNOSIS — Z9181 History of falling: Secondary | ICD-10-CM | POA: Diagnosis not present

## 2019-12-26 DIAGNOSIS — F419 Anxiety disorder, unspecified: Secondary | ICD-10-CM | POA: Diagnosis not present

## 2019-12-26 DIAGNOSIS — G20 Parkinson's disease: Secondary | ICD-10-CM | POA: Diagnosis not present

## 2019-12-26 DIAGNOSIS — F0280 Dementia in other diseases classified elsewhere without behavioral disturbance: Secondary | ICD-10-CM | POA: Diagnosis not present

## 2019-12-26 DIAGNOSIS — D649 Anemia, unspecified: Secondary | ICD-10-CM | POA: Diagnosis not present

## 2019-12-26 DIAGNOSIS — F329 Major depressive disorder, single episode, unspecified: Secondary | ICD-10-CM | POA: Diagnosis not present

## 2019-12-26 DIAGNOSIS — Z8673 Personal history of transient ischemic attack (TIA), and cerebral infarction without residual deficits: Secondary | ICD-10-CM | POA: Diagnosis not present

## 2019-12-26 DIAGNOSIS — Z853 Personal history of malignant neoplasm of breast: Secondary | ICD-10-CM | POA: Diagnosis not present

## 2019-12-26 NOTE — Telephone Encounter (Signed)
Spoke to patients husband who wanted to inform Dr. Delice Lesch on how patient is doing. Per patients husband patient was taken off Carbidopa levodopa at her last office visit and began doing poorly shortly after. Patient was unstable and collapsing a lot. Patients husband has started the patient back on the Carbidopa Levodopa 3 days ago and patient has slightly been improving and not collapsing as much.  Another concern that patients husband is having is patients anxiety. He states that she has been very very anxious and would like her to have something to help her with this.   Patients husband also would like to know when Manuela Schwartz will be calling to schedule her eeg.   Informed patients husband that I will send a message to Dr. Delice Lesch and Nira Conn to inform and advise.

## 2019-12-26 NOTE — Telephone Encounter (Signed)
Called no answer left a voice mail to call the office back

## 2019-12-26 NOTE — Telephone Encounter (Signed)
Patient's spouse called requesting to speak with a nurse to give an update on the patient.

## 2019-12-27 NOTE — Telephone Encounter (Signed)
Received message that she appears to be doing better with speech therapy techniques for drooling. Pls ask if they would still like to proceed with Botox

## 2019-12-27 NOTE — Progress Notes (Addendum)
[facility] [place] - PA Case ID: [id number] Need help? Call us at [phone] Outcome Approvedon October 31 PA Case: [id number], Status: Approved, Coverage Starts on: 12/29/2019 12:00:00 AM, Coverage Ends on: 02/27/2021 12:00:00 AM. Drug Myobloc 5000UNIT/ML solution Form [employer] 4-Part Electronic PA Form [phone] NCPDP)

## 2019-12-27 NOTE — Telephone Encounter (Signed)
Informed pt husband that we will cancel Botox, call if any changes.

## 2019-12-30 NOTE — Progress Notes (Signed)
Per message from [place] - they would like to cancel botox at this time. I will call SP and let them know.

## 2019-12-31 DIAGNOSIS — R1312 Dysphagia, oropharyngeal phase: Secondary | ICD-10-CM | POA: Diagnosis not present

## 2019-12-31 DIAGNOSIS — R32 Unspecified urinary incontinence: Secondary | ICD-10-CM | POA: Diagnosis not present

## 2019-12-31 DIAGNOSIS — F0280 Dementia in other diseases classified elsewhere without behavioral disturbance: Secondary | ICD-10-CM | POA: Diagnosis not present

## 2019-12-31 DIAGNOSIS — Z741 Need for assistance with personal care: Secondary | ICD-10-CM | POA: Diagnosis not present

## 2019-12-31 DIAGNOSIS — D649 Anemia, unspecified: Secondary | ICD-10-CM | POA: Diagnosis not present

## 2019-12-31 DIAGNOSIS — Z8673 Personal history of transient ischemic attack (TIA), and cerebral infarction without residual deficits: Secondary | ICD-10-CM | POA: Diagnosis not present

## 2019-12-31 DIAGNOSIS — F419 Anxiety disorder, unspecified: Secondary | ICD-10-CM | POA: Diagnosis not present

## 2019-12-31 DIAGNOSIS — F329 Major depressive disorder, single episode, unspecified: Secondary | ICD-10-CM | POA: Diagnosis not present

## 2019-12-31 DIAGNOSIS — Z9181 History of falling: Secondary | ICD-10-CM | POA: Diagnosis not present

## 2019-12-31 DIAGNOSIS — G20 Parkinson's disease: Secondary | ICD-10-CM | POA: Diagnosis not present

## 2019-12-31 DIAGNOSIS — Z853 Personal history of malignant neoplasm of breast: Secondary | ICD-10-CM | POA: Diagnosis not present

## 2020-01-01 DIAGNOSIS — F419 Anxiety disorder, unspecified: Secondary | ICD-10-CM | POA: Diagnosis not present

## 2020-01-01 DIAGNOSIS — R1312 Dysphagia, oropharyngeal phase: Secondary | ICD-10-CM | POA: Diagnosis not present

## 2020-01-01 DIAGNOSIS — D649 Anemia, unspecified: Secondary | ICD-10-CM | POA: Diagnosis not present

## 2020-01-01 DIAGNOSIS — Z853 Personal history of malignant neoplasm of breast: Secondary | ICD-10-CM | POA: Diagnosis not present

## 2020-01-01 DIAGNOSIS — Z8673 Personal history of transient ischemic attack (TIA), and cerebral infarction without residual deficits: Secondary | ICD-10-CM | POA: Diagnosis not present

## 2020-01-01 DIAGNOSIS — G20 Parkinson's disease: Secondary | ICD-10-CM | POA: Diagnosis not present

## 2020-01-01 DIAGNOSIS — F0280 Dementia in other diseases classified elsewhere without behavioral disturbance: Secondary | ICD-10-CM | POA: Diagnosis not present

## 2020-01-01 DIAGNOSIS — R32 Unspecified urinary incontinence: Secondary | ICD-10-CM | POA: Diagnosis not present

## 2020-01-01 DIAGNOSIS — F329 Major depressive disorder, single episode, unspecified: Secondary | ICD-10-CM | POA: Diagnosis not present

## 2020-01-01 DIAGNOSIS — Z9181 History of falling: Secondary | ICD-10-CM | POA: Diagnosis not present

## 2020-01-02 ENCOUNTER — Ambulatory Visit: Payer: PPO | Admitting: Neurology

## 2020-01-02 DIAGNOSIS — D649 Anemia, unspecified: Secondary | ICD-10-CM | POA: Diagnosis not present

## 2020-01-02 DIAGNOSIS — R32 Unspecified urinary incontinence: Secondary | ICD-10-CM | POA: Diagnosis not present

## 2020-01-02 DIAGNOSIS — F419 Anxiety disorder, unspecified: Secondary | ICD-10-CM | POA: Diagnosis not present

## 2020-01-02 DIAGNOSIS — R1312 Dysphagia, oropharyngeal phase: Secondary | ICD-10-CM | POA: Diagnosis not present

## 2020-01-02 DIAGNOSIS — Z853 Personal history of malignant neoplasm of breast: Secondary | ICD-10-CM | POA: Diagnosis not present

## 2020-01-02 DIAGNOSIS — F329 Major depressive disorder, single episode, unspecified: Secondary | ICD-10-CM | POA: Diagnosis not present

## 2020-01-02 DIAGNOSIS — Z9181 History of falling: Secondary | ICD-10-CM | POA: Diagnosis not present

## 2020-01-02 DIAGNOSIS — G20 Parkinson's disease: Secondary | ICD-10-CM | POA: Diagnosis not present

## 2020-01-02 DIAGNOSIS — F0280 Dementia in other diseases classified elsewhere without behavioral disturbance: Secondary | ICD-10-CM | POA: Diagnosis not present

## 2020-01-02 DIAGNOSIS — Z8673 Personal history of transient ischemic attack (TIA), and cerebral infarction without residual deficits: Secondary | ICD-10-CM | POA: Diagnosis not present

## 2020-01-10 DIAGNOSIS — I10 Essential (primary) hypertension: Secondary | ICD-10-CM | POA: Diagnosis not present

## 2020-01-10 DIAGNOSIS — Z9181 History of falling: Secondary | ICD-10-CM | POA: Diagnosis not present

## 2020-01-11 DIAGNOSIS — R269 Unspecified abnormalities of gait and mobility: Secondary | ICD-10-CM | POA: Diagnosis not present

## 2020-01-11 DIAGNOSIS — G20 Parkinson's disease: Secondary | ICD-10-CM | POA: Diagnosis not present

## 2020-01-13 ENCOUNTER — Telehealth: Payer: Self-pay

## 2020-01-13 NOTE — Telephone Encounter (Signed)
Drastic change from morning to afternoon with increase in drooling. Therapist is asking if drooling is coming from carbidopa? She would also like verbal orders for Speech therapy 1 time x 4 weeks, and for nurse evaluation  for medication management

## 2020-01-13 NOTE — Telephone Encounter (Signed)
Sp[facility] with [facility] We have gone off and on carbidopa several times, Dr Delice Lesch dose not  think it's due to medication but more to her underlying condition. [place] for verbal orders and nurse eval

## 2020-01-13 NOTE — Telephone Encounter (Signed)
We have gone off and on carbidopa several times, I don't think it's due to medication but more to her underlying condition. [place] for verbal orders and nurse eval. Thanks

## 2020-01-20 ENCOUNTER — Ambulatory Visit: Payer: PPO | Admitting: Neurology

## 2020-01-20 ENCOUNTER — Other Ambulatory Visit: Payer: Self-pay

## 2020-01-20 DIAGNOSIS — G3183 Dementia with Lewy bodies: Secondary | ICD-10-CM

## 2020-01-20 DIAGNOSIS — F0281 Dementia in other diseases classified elsewhere with behavioral disturbance: Secondary | ICD-10-CM

## 2020-01-20 DIAGNOSIS — R4182 Altered mental status, unspecified: Secondary | ICD-10-CM | POA: Diagnosis not present

## 2020-01-21 DIAGNOSIS — Z853 Personal history of malignant neoplasm of breast: Secondary | ICD-10-CM | POA: Diagnosis not present

## 2020-01-21 DIAGNOSIS — F419 Anxiety disorder, unspecified: Secondary | ICD-10-CM | POA: Diagnosis not present

## 2020-01-21 DIAGNOSIS — F0280 Dementia in other diseases classified elsewhere without behavioral disturbance: Secondary | ICD-10-CM | POA: Diagnosis not present

## 2020-01-21 DIAGNOSIS — G20 Parkinson's disease: Secondary | ICD-10-CM | POA: Diagnosis not present

## 2020-01-21 DIAGNOSIS — Z9181 History of falling: Secondary | ICD-10-CM | POA: Diagnosis not present

## 2020-01-21 DIAGNOSIS — Z8673 Personal history of transient ischemic attack (TIA), and cerebral infarction without residual deficits: Secondary | ICD-10-CM | POA: Diagnosis not present

## 2020-01-21 DIAGNOSIS — D649 Anemia, unspecified: Secondary | ICD-10-CM | POA: Diagnosis not present

## 2020-01-21 DIAGNOSIS — F329 Major depressive disorder, single episode, unspecified: Secondary | ICD-10-CM | POA: Diagnosis not present

## 2020-01-21 DIAGNOSIS — R1312 Dysphagia, oropharyngeal phase: Secondary | ICD-10-CM | POA: Diagnosis not present

## 2020-01-21 DIAGNOSIS — R32 Unspecified urinary incontinence: Secondary | ICD-10-CM | POA: Diagnosis not present

## 2020-01-28 DIAGNOSIS — F419 Anxiety disorder, unspecified: Secondary | ICD-10-CM | POA: Diagnosis not present

## 2020-01-28 DIAGNOSIS — Z9181 History of falling: Secondary | ICD-10-CM | POA: Diagnosis not present

## 2020-01-28 DIAGNOSIS — D649 Anemia, unspecified: Secondary | ICD-10-CM | POA: Diagnosis not present

## 2020-01-28 DIAGNOSIS — Z853 Personal history of malignant neoplasm of breast: Secondary | ICD-10-CM | POA: Diagnosis not present

## 2020-01-28 DIAGNOSIS — G20 Parkinson's disease: Secondary | ICD-10-CM | POA: Diagnosis not present

## 2020-01-28 DIAGNOSIS — Z8673 Personal history of transient ischemic attack (TIA), and cerebral infarction without residual deficits: Secondary | ICD-10-CM | POA: Diagnosis not present

## 2020-01-28 DIAGNOSIS — R32 Unspecified urinary incontinence: Secondary | ICD-10-CM | POA: Diagnosis not present

## 2020-01-28 DIAGNOSIS — F329 Major depressive disorder, single episode, unspecified: Secondary | ICD-10-CM | POA: Diagnosis not present

## 2020-01-28 DIAGNOSIS — F0280 Dementia in other diseases classified elsewhere without behavioral disturbance: Secondary | ICD-10-CM | POA: Diagnosis not present

## 2020-01-28 DIAGNOSIS — R1312 Dysphagia, oropharyngeal phase: Secondary | ICD-10-CM | POA: Diagnosis not present

## 2020-01-29 DIAGNOSIS — F0280 Dementia in other diseases classified elsewhere without behavioral disturbance: Secondary | ICD-10-CM | POA: Diagnosis not present

## 2020-01-29 DIAGNOSIS — R1312 Dysphagia, oropharyngeal phase: Secondary | ICD-10-CM | POA: Diagnosis not present

## 2020-01-29 DIAGNOSIS — Z9181 History of falling: Secondary | ICD-10-CM | POA: Diagnosis not present

## 2020-01-29 DIAGNOSIS — R32 Unspecified urinary incontinence: Secondary | ICD-10-CM | POA: Diagnosis not present

## 2020-01-29 DIAGNOSIS — D649 Anemia, unspecified: Secondary | ICD-10-CM | POA: Diagnosis not present

## 2020-01-29 DIAGNOSIS — F329 Major depressive disorder, single episode, unspecified: Secondary | ICD-10-CM | POA: Diagnosis not present

## 2020-01-29 DIAGNOSIS — G20 Parkinson's disease: Secondary | ICD-10-CM | POA: Diagnosis not present

## 2020-01-29 DIAGNOSIS — F419 Anxiety disorder, unspecified: Secondary | ICD-10-CM | POA: Diagnosis not present

## 2020-01-29 DIAGNOSIS — Z8673 Personal history of transient ischemic attack (TIA), and cerebral infarction without residual deficits: Secondary | ICD-10-CM | POA: Diagnosis not present

## 2020-01-29 DIAGNOSIS — Z853 Personal history of malignant neoplasm of breast: Secondary | ICD-10-CM | POA: Diagnosis not present

## 2020-01-30 ENCOUNTER — Telehealth: Payer: Self-pay

## 2020-01-30 NOTE — Telephone Encounter (Signed)
-----   Message from Cameron Sprang, MD sent at 01/29/2020  4:34 PM EST ----- Regarding: EEG results Pls let husband know the EEG did not show any seizure activity during the events. No med changes, continue to try to avoid low BP, increase hydration as much as able. Thanks

## 2020-01-30 NOTE — Telephone Encounter (Signed)
Spoke with Kellie Moore to let him know that EEG did not show any seizure activity during the events. No med changes, continue to try to avoid low BP, increase hydration as much as able. Kellie Moore was asking what was the 4 events that happened the night before they came in to have the EEG taken off? He would like to talk to Kellie Moore even if they have to make an appointment to talk to her so they can try to see what is going on.

## 2020-02-04 DIAGNOSIS — Z741 Need for assistance with personal care: Secondary | ICD-10-CM | POA: Diagnosis not present

## 2020-02-04 DIAGNOSIS — G20 Parkinson's disease: Secondary | ICD-10-CM | POA: Diagnosis not present

## 2020-02-04 DIAGNOSIS — F0280 Dementia in other diseases classified elsewhere without behavioral disturbance: Secondary | ICD-10-CM | POA: Diagnosis not present

## 2020-02-07 DIAGNOSIS — R1312 Dysphagia, oropharyngeal phase: Secondary | ICD-10-CM | POA: Diagnosis not present

## 2020-02-07 DIAGNOSIS — F0280 Dementia in other diseases classified elsewhere without behavioral disturbance: Secondary | ICD-10-CM | POA: Diagnosis not present

## 2020-02-07 DIAGNOSIS — Z9181 History of falling: Secondary | ICD-10-CM | POA: Diagnosis not present

## 2020-02-07 DIAGNOSIS — F419 Anxiety disorder, unspecified: Secondary | ICD-10-CM | POA: Diagnosis not present

## 2020-02-07 DIAGNOSIS — Z853 Personal history of malignant neoplasm of breast: Secondary | ICD-10-CM | POA: Diagnosis not present

## 2020-02-07 DIAGNOSIS — G20 Parkinson's disease: Secondary | ICD-10-CM | POA: Diagnosis not present

## 2020-02-07 DIAGNOSIS — Z8673 Personal history of transient ischemic attack (TIA), and cerebral infarction without residual deficits: Secondary | ICD-10-CM | POA: Diagnosis not present

## 2020-02-07 DIAGNOSIS — R32 Unspecified urinary incontinence: Secondary | ICD-10-CM | POA: Diagnosis not present

## 2020-02-07 DIAGNOSIS — F329 Major depressive disorder, single episode, unspecified: Secondary | ICD-10-CM | POA: Diagnosis not present

## 2020-02-07 DIAGNOSIS — D649 Anemia, unspecified: Secondary | ICD-10-CM | POA: Diagnosis not present

## 2020-02-10 DIAGNOSIS — R269 Unspecified abnormalities of gait and mobility: Secondary | ICD-10-CM | POA: Diagnosis not present

## 2020-02-10 DIAGNOSIS — G20 Parkinson's disease: Secondary | ICD-10-CM | POA: Diagnosis not present

## 2020-02-10 NOTE — Telephone Encounter (Signed)
Scheduled earlier appt for 02/11/20

## 2020-02-11 ENCOUNTER — Ambulatory Visit: Payer: PPO | Admitting: Neurology

## 2020-02-11 ENCOUNTER — Other Ambulatory Visit: Payer: Self-pay

## 2020-02-11 ENCOUNTER — Encounter: Payer: Self-pay | Admitting: Neurology

## 2020-02-11 VITALS — BP 97/67 | HR 65 | Ht 67.0 in | Wt 91.0 lb

## 2020-02-11 DIAGNOSIS — G3183 Dementia with Lewy bodies: Secondary | ICD-10-CM | POA: Diagnosis not present

## 2020-02-11 DIAGNOSIS — F419 Anxiety disorder, unspecified: Secondary | ICD-10-CM

## 2020-02-11 DIAGNOSIS — R1310 Dysphagia, unspecified: Secondary | ICD-10-CM | POA: Diagnosis not present

## 2020-02-11 DIAGNOSIS — G20 Parkinson's disease: Secondary | ICD-10-CM | POA: Diagnosis not present

## 2020-02-11 DIAGNOSIS — R32 Unspecified urinary incontinence: Secondary | ICD-10-CM | POA: Diagnosis not present

## 2020-02-11 DIAGNOSIS — F0281 Dementia in other diseases classified elsewhere with behavioral disturbance: Secondary | ICD-10-CM | POA: Diagnosis not present

## 2020-02-11 DIAGNOSIS — R1312 Dysphagia, oropharyngeal phase: Secondary | ICD-10-CM | POA: Diagnosis not present

## 2020-02-11 DIAGNOSIS — Z8673 Personal history of transient ischemic attack (TIA), and cerebral infarction without residual deficits: Secondary | ICD-10-CM | POA: Diagnosis not present

## 2020-02-11 DIAGNOSIS — F0280 Dementia in other diseases classified elsewhere without behavioral disturbance: Secondary | ICD-10-CM | POA: Diagnosis not present

## 2020-02-11 DIAGNOSIS — F329 Major depressive disorder, single episode, unspecified: Secondary | ICD-10-CM | POA: Diagnosis not present

## 2020-02-11 DIAGNOSIS — I951 Orthostatic hypotension: Secondary | ICD-10-CM | POA: Diagnosis not present

## 2020-02-11 DIAGNOSIS — Z9181 History of falling: Secondary | ICD-10-CM | POA: Diagnosis not present

## 2020-02-11 DIAGNOSIS — D649 Anemia, unspecified: Secondary | ICD-10-CM | POA: Diagnosis not present

## 2020-02-11 DIAGNOSIS — Z853 Personal history of malignant neoplasm of breast: Secondary | ICD-10-CM | POA: Diagnosis not present

## 2020-02-11 MED ORDER — ESCITALOPRAM OXALATE 5 MG PO TABS: 5.0000 mg | ORAL_TABLET | Freq: Every day | ORAL | 11 refills | Status: DC

## 2020-02-11 NOTE — Patient Instructions (Signed)
1. Increase Midodrine 5mg : take 1 tablet every 4 hours. Monitor BP when lying down.   2. Start Lexapro 5mg  every night  3. Continue Carbidopa/Levodopa 25/100mg  three times a day and Donepezil 10mg  daily  4. Will look into ordering suction machine  5. Follow-up as scheduled in January   [place]  . Online Resources for Power over Parkinson's Group . Local [place] Online Groups  o Power over [employer] Group :   - Power Over Parkinson's Patient Education Group will be Wednesday, December 8th at 2pm via Zoom.   - Upcoming Power over Parkinson's Meetings:  2nd Wednesdays of the month at 2 pm:       January 12th, February 9th - Amy Marriott, PT at [facility] has resumed the lead of this group starting in July.  Contact Amy at [email or web address] if interested in participating in this online group o Parkinson's Care Partners Group:    3rd Mondays, Contact Corwin Levins o Atypical Parkinsonian Patient Group:   4th Wednesdays, Contact Corwin Levins o If you are interested in participating in these online groups with Judson Roch, please contact her directly for how to join those meetings.  Her contact information is [email or web address].  She will send you a link to join the [employer].  (Please note that Corwin Levins , MSW, LCSW, has resigned her position at [facility], but will continue to lead the online groups temporarily) .  Marland Kitchen [place]:  [email or web address] o PD Health at Home continues:  Mindfulness Mondays, Expert Briefing Tuesdays, Wellness Wednesdays, Take Time Thursdays, Fitness Fridays  o [employer]:  (Next one is February 2022, stay tuned) o Please check out their website to sign up for emails and see their full online offerings .  Marland Kitchen [place]:  [email or web address]  o Upcoming Webinar:   Stay tuned for 2022 o Check out additional information on their website to see their full  online offerings .  Marland Kitchen [place]:  [email or web address] o Upcoming Webinar:  Stay tuned for 2022 o Care Partner Monthly Meetup.  With Robin Searing [place].  First Tuesday of each month, 2 pm o Check out additional information to Live Well Today on their website .  Marland Kitchen Parkinson and Movement Disorders (PMD) Alliance:  www.[email or web address] o [employer] Online:  Online Education Events o Sign up for emails, which are sent weekly to give you updates on programming and online offerings .  Marland Kitchen Parkinson's Association of the [place]:  www.[email or web address] o Information on online support groups, online exercises including Yoga, Parkinson's exercises and more-LOTS of information on links to PD resources and online events o Virtual Support Group through Parkinson's Association of the [place]; next one is scheduled for Wednesday, January 29, 2020 at 2 pm. (These are typically scheduled for the 1st Wednesday of the month at 2 pm).  Visit website for details. .  . Additional links for movement activities: o PWR! Moves Classes at [place] RESUMED, at a limited capacity.  We have several openings for Wednesday 10 am and 11 am classes.  Contact Amy Marriott, PT [email or web address] or [phone] if interested o Here is a link to the PWR!Moves classes on Zoom from [place] - Daily Mon-Sat at 10:00. Via Zoom, FREE and open to all.  There is also a link below via Facebook if you use that platform. - [email or web address].si - https://www.PrepaidParty.no o Parkinson's Wellness Recovery (PWR! Moves)  www.pwr4life.org - Info on the PWR! Virtual Experience:  You will  have access to our expertise through self-assessment, guided plans that start with the PD-specific  fundamentals, educational content, tips, Q&A with an expert, and a growing Art therapist of PD-specific pre-recorded and live exercise classes of varying types and intensity - both physical and cognitive! If that is not enough, we offer 1:1 wellness consultations (in-person or virtual) to personalize your PWR! Research scientist (medical).  - Check out the PWR! Move of the month on the [place] [facility] website:  [email or web address] o [employer] Fridays:  - As part of the PD Health @ Home program, this free video series focuses each week on one aspect of fitness designed to support people living with Parkinson's.  -  HollywoodSale.dk o Dance for PD website is offering free, live-stream classes throughout the week, as well as links to [employer] of classes:  [email or web address]/ o Transport planner for Parkinson's Class:  [place] is back this Fall!  Free offering for people with Parkinson's and care partners; virtual class this Fall. The class will be Wednesdays 4-5pm beginning 10/13.  Classes will run for 9 weeks 10/13-12/15,.  Register below: o [email or web address]?[email or web address]&[email or web address]+Dance-[email or web address]  o For more information, contact [phone] or email Ruffin Frederick at [email or web address] o Virtual dance and Pilates for Parkinson's classes: Click on the Community Tab> Parkinson's Movement Initiative Tab.  To register for classes and for more information, visit www.[email or web address] and click the "community" tab.  o [employer] Parkinson's Cycling Classes  - [employer]: 1pm on Fridays-Live classes at [facility] [employer] at [email or web address] or [phone]) [facility] [employer]: Virtual Classes Mondays and Thursdays (contact [place] at  [email or web address] or [phone]) .  o [employer] - Three levels of classes are offered Tuesdays and Thursdays:  10:30 am,  12 noon & 1:45 pm at [employer]. To observe a class or for  more information, call [phone] or email [email or web address] . [facility] [employer]: o Chief Technology Officer Opportunities:  www.well-springsolutions.org/caregiver-education/caregiver-support-group.  You may also contact Vickki Muff at [email or web address] or [phone].   o Caregiver Virtual Event:   [facility] is having a [phone], Thursday, December 9th from 6-7 pm - Contact [birth year]-1990 (above) for details o [facility] Navigator:  Just1Navigator program, a free service to help individuals and families through the journey of determining care for older adults.  The "Navigator" is a Vickki Muff, Education officer, museum, who will speak with a prospective client and/or loved ones to provide an assessment of the situation and a set of recommendations for a personalized care plan -- all free of charge, and whether [facility] offers the needed service or not. If the need is not a service we provide, we are well-connected with reputable programs in town that we can refer you to.  www.well-springsolutions.org or to speak with the Navigator, call [phone].

## 2020-02-11 NOTE — Progress Notes (Signed)
NEUROLOGY FOLLOW UP OFFICE NOTE  Kellie Moore [id number] [birth year]  HISTORY OF PRESENT ILLNESS: I had the pleasure of seeing Kellie Moore in follow-up in the neurology clinic on 02/11/2020.  The patient was last seen 2 months ago. She is again accompanied by her husband who helps supplement the history today.  Records and images were personally reviewed where available.  Since her last visit, she had a 48-hour EEG done due to episodes of unresponsiveness. There were no interictal epileptiform discharges seen. She had several episodes where she was less responsive and collapsed. During some of them, there was diffuse slowing seen, however some events had no EEG changes. Discussed that these EEG changes may be due to hypoperfusion, advised husband to increase hydration and monitor BP.  She presents today with worsening symptoms. Her husband notes her dysphagia is worse. She needs to cough when she drinks to clear her lungs. The drooling seems to improve when he massages her legs. Her depression is worse, Trazodone has helped with sleep but she is deeply depressed and has a lot of anxiety. She would vocalize sometimes, usually when she is upset. When she gets up in the morning, she has good energy and sometimes even walks without her rollator, but in the afternoon and evening he has to walk behind her because she may pass out. She is very dependent on him with this. She was started on midodrine by her PCP. Her husband initially felt that Sinemet was worsening her symptoms, however when he stopped it, she started having tremors, dizziness was worse, "it was a disaster." Tremors went away once Sinemet was restarted. She has a lot of hallucinations/delusions, thinking there are 3 of her husband.    History on Initial Assessment 08/09/2017: This is a pleasant 69 yo RH woman with a history of breast cancer s/p lumpectomy and radiation, in her usual state of health until 2 years ago when she started having changes  in memory and personality. She feels her memory is pretty good, she has noticed some changes every once in a while with trouble getting her words out. Her husband has noticed similar changes, she would say the wrong word. He has also noted a change in behavior with significant anxiety. He feels changes may have started 10 years ago when she was diagnosed with possible breast cancer. Since then she has been hypervigilant about her health. She changed her diet and stopped eating red meat and sugar. She has lost 40 lbs in the past 10 years. She states that her mother had 3 bouts of breast cancer, up to 10 years apart. She has no prior history of significant anxiety, but now feels like she will climb out her skin. She feels like "the bodysnatchers got me, it's not me." She had a prescription for Xanax but has not taken it. Her husband reports that she used to be very active with several projects ongoing at the same time, but in the past 2 years, she has become more lethargic, just wanting to lay around the house. She used to exercise on the treadmill regularly. She states that she has significant fatigue. She used to cook a lot, but now her husband does the cooking. They share bill responsibilities, no missed bills but her husband reports she would now pay at the last moment, which is not like her. He states she drives him crazy with questions all the time on a day to day basis. No driving concerns, but she does not feel  comfortable, especially when her hand shakes. She and her husband started noticing hand tremors over the past 2 years, she feels they are mild and intermittent, he has noticed occasional times where tremor is more pronounced. She feels the shaking is inside. It does not affect eating, but her handwriting has changed. Her husband reports she used to have big, beautiful handwriting, now she has to concentrate to write and it has become smaller. She has also noticed drooling on the left side of her mouth,  which is very annoying for her. Sleep feels her sleep is good, no REM behavior disorder, but her husband reports loud snoring and apnea.  She has occasional slight tingling in her right hand. No headaches, dizziness, diplopia, dysarthria/dysphagia, neck/back pain, bowel/bladder dysfunction. No changes in gait or frequent falls. She feels all this first happened when she broke out in hives, she was tested for allergies and found allergic to dust mite and mold. They found mold in their house around a year ago and had the flooring changed, so husband reports it is not an issue any longer. Her maternal uncle had tremors. She states her father had a cerebral hemorrhage, but her husband also reports that her father had similar lethargy and had an MRI which did not find anything, but on autopsy was found to have a brain tumor. She denies any significant head injuries. She occasionally drinks alcohol, and gets the "[employer]" when she drinks.  Update 12/16/2019: Since her last visit, she has been admitted to the hospital twice for episodes of unresponsiveness Records were reviewed. On 7/26, her husband found her unresponsive, not following commands. She was evaluated by Neurology with an NIHSS of 13, non-focal, felt due to underlying neurodegenerative disease. She significantly improved the next day, symptoms felt due to cognitive fluctuation from PD. Her EEG was normal. MRI brain no acute changes, with mildly advanced diffuse atrophy. She was discharged with home health. She was back in the hospital on 9/13 again unresponsive. Repeat EEG normal, head CT unremarkable. Her husband talked to other people and felt that these episodes were due to the atropine drops he puts under her tongue for sialorrhea. He stopped if last month. She had another milder episode 3 days ago. She did fine with physical therapy that morning, she did PT exercise and walked around and was told she did not need PT any longer. Three hours  later, she took her walker then had a funny look on her face. Her hands were shaky, her head went down and she slumped and collapsed on the floor. He stretched her out, checked her pulse and breathing, then she looked at him and asked what was going on. He stood her up then her legs got so weak she could not stand up. When he got her up, BP was 90/60. On average she runs 120/70. She takes Midodrine BID (breakfast and lunch). She has the funny look on her face 2-3 times a day, even when supine. Her husband has several concerns. Her drooling has become significantly worse, "like molasses coming out." The sundowning, hallucinations and delusions have increased significantly, she thinks her parents are with her and there are 3 of her husband. She wants to go home every night. She confuses dreams with reality, no REM behavior disorder. She is beginning to have bladder incontinence and wears Depends. She complains of chills all day even if it is 74 degrees at home. She has sleep difficulties, he gave her a left over Valium 3  months ago and she slept like a baby. She is on mirtazapine 30mg  qhs, Sinemet 25/100mg  TID, and Donepezil 10mg  daily. On good days, she walks up and down the house really well. Her sense of humor is still pretty good.    Laboratory Data: TSH and B12 were done at PCP office, results unavailable for review MRI brain with and without contrast done 08/15/17 did not show any acute changes. There was note of a small 56mm lesion within the right aspect of the pituitary gland   PAST MEDICAL HISTORY: Past Medical History:  Diagnosis Date  . Cancer [facility])    Breast cancer  . Parkinson's disease ([place])   . 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 bisacodyl (BISACODYL) 5 MG EC tablet Take 5 mg by mouth at bedtime as needed for mild constipation or moderate constipation.    . carbidopa-levodopa  (SINEMET IR) 25-100 MG tablet Take 1 tablet by mouth 3 (three) times daily.    Marland Kitchen donepezil (ARICEPT) 10 MG tablet Take 1/2 tablet daily for 2 weeks, then increase to 1 tablet daily (Patient taking differently: Take 10 mg by mouth in the morning.) 30 tablet 11  . Elastic Bandages & Supports (ABDOMINAL BINDER/ELASTIC SMALL) MISC Use during the day 1 each 0  . Ferrous Sulfate (IRON PO) Take by mouth daily.    . Magnesium Bisglycinate (MAG GLYCINATE PO) Take by mouth.    . midodrine (PROAMATINE) 5 MG tablet     . oxymetazoline (AFRIN) 0.05 % nasal spray Place 1 spray into both nostrils 2 (two) times daily as needed for congestion.    . traZODone (DESYREL) 50 MG tablet Take 1/2 tablet every night for 1 week, then increase to 1 tablet every night 30 tablet 11   No current facility-administered medications on file prior to visit.    ALLERGIES: Allergies  Allergen Reactions  . Other Itching and Other (See Comments)    Dust and pollen = Itchy eyes and runny nose    FAMILY HISTORY: Family History  Problem Relation Age of Onset  . Brain cancer Father     SOCIAL HISTORY: Social History   Socioeconomic History  . Marital status: Married    Spouse name: Not on file  . Number of children: Not on file  . Years of education: Not on file  . Highest education level: Not on file  Occupational History  . Occupation: retired  Tobacco Use  . Smoking status: Never Smoker  . Smokeless tobacco: Never Used  Vaping Use  . Vaping Use: Never used  Substance and Sexual Activity  . Alcohol use: Not Currently    Comment: margarita's on fridays  . Drug use: Never  . Sexual activity: Not on file  Other Topics Concern  . Not on file  Social History Narrative   Lives with husband two story home      Right handed      Masters degree      Retired from [employer] and then [employer] at [employer]: Not on [employer] Insecurity: Not on file   Transportation Needs: Not on file  Physical Activity: Not on file  Stress: Not on file  Social Connections: Not on file  Intimate Partner Violence: Not on file     PHYSICAL EXAM: Vitals:   02/11/20 1123  BP: 97/67  Pulse:  65  SpO2: 100%   General: No acute distress, +hypomimia and significant sialorrhea Head:  Normocephalic/atraumatic Skin/Extremities: No rash, no edema Neurological Exam: alert and awake. No aphasia or dysarthria. Fund of knowledge is reduced. Recent and remote memory are impaired. Attention and concentration are normal.   Cranial nerves: Pupils equal, round. Extraocular movements intact with no nystagmus. Visual fields full.  No facial asymmetry.  Motor: +cogwheeling bilaterally. muscle strength 5/5 throughout with no pronator drift. Finger to nose testing intact.  Gait: ambulates with walker with fair strides, narrow-based. No tremor today.   IMPRESSION: This is a pleasant 69 yo RH woman with a history of breast cancer s/p lumpectomy and radiation, who presented with a 2 year history of tremors, cognitive, and personality changes. Her DATscan in 2019 showed decreased striatal activity on left and right in a pattern consistent with Parkinson's syndrome pathology, greatest deficit in the posterior right putamen. She also has hallucinations and delusion. Symptoms likely due to Lewy body dementia. She has episodes of fluctuating cognition, prolonged EEG did not show any seizure activity, there was diffuse slowing during some of these episodes, concerning for hypoperfusion. We discussed increasing Midodrine 5mg  to take every 4 hours during wakefulness, monitor for supine hypertension. Her husband also reports worsening depression and anxiety, difficulty finding a psychiatrist to help with management. Start Lexapro 5mg  daily, we will plan to increase as tolerated, side effects discussed. Continue Sinemet 25/100mg  TID and Donepezil 10mg  daily. We again discussed Botox for  sialorrhea, she is hesitant. Speech therapy has recommended a suction machine. Continue close supervision, we discussed doing 1 medication change at a time and will consider Nuplazid for hallucinations on her next follow-up in January.    Thank you for allowing me to participate in her care.  Please do not hesitate to call for any questions or concerns.   Ellouise Newer, M.D.   CC: Dr. Lysle Rubens

## 2020-02-18 ENCOUNTER — Telehealth: Payer: Self-pay

## 2020-02-18 NOTE — Telephone Encounter (Signed)
Spoke with pt Husband Kellie Moore by her self didn't ring her bell she passed out in the doorway and hit her head the home health RN came in today and per protocal had to call the office to let us know about the fall. Pt husband stated that Oral is fine no injuries.

## 2020-02-18 NOTE — Telephone Encounter (Signed)
Noted. Does he want to look into having a caregiver with them during the day so he can have respite as well? Can provide info of [facility] for resources. Thanks

## 2020-03-02 NOTE — Procedures (Signed)
ELECTROENCEPHALOGRAM REPORT  Dates of Recording: 01/20/2020 10:00AM to 01/22/2020 10:04 AM  Patient's Name: Kellie Moore MRN: [id number] Date of Birth: 03/17/1950  Referring Provider: Dr. Patrcia Dolly  Procedure: 48-hour ambulatory video EEG  History: This is a 70 year old woman with Lewy body dementia with recurrent episodes of decreased responsiveness. EEG for classification.   Medications:  Sinemet Donepezil Mirtazapine Midodrine  Technical Summary: This is a 48-hour multichannel digital video EEG recording measured by the international 10-20 system with electrodes applied with paste and impedances below 5000 ohms performed as portable with EKG monitoring.  The digital EEG was referentially recorded, reformatted, and digitally filtered in a variety of bipolar and referential montages for optimal display.    DESCRIPTION OF RECORDING: During maximal wakefulness, the background activity consisted of a symmetric 9 Hz posterior dominant rhythm which was reactive to eye opening.  There is a small amount of diffuse theta and delta slowing of the waking background. There were no epileptiform discharges or focal slowing seen in wakefulness.  During the recording, the patient progresses through wakefulness, drowsiness, and Stage 2 sleep. During drowsiness and sleep, there is an increase in theta and delta slowing seen, with shifting asymmetry over the bilateral temporal region, left more than right. Again, there were no epileptiform discharges seen.  Events: On 11/22 at 1215 hours, husband reports drooling, "crickets." Patient is sitting on the couch, looking and talking to husband. No clinical changes seen. Electrographically, there were no EEG or EKG changes seen.  On 11/22 at 1317 and 1318 hours, husband reports "crickets in the field" and small tremors while standing. Patient had been supine on the couch, he stands her up, she is unable to walk, husband holding her from behind. She appears  to be looking straight, she talks a little then falls back on husband who sits her back down. Electrographically, around 10 seconds after standing, there is diffuse theta and delta slowing seen lasting for around 20 seconds with no evolution in frequency or amplitude. EKG shows regular rhythm at 90 bpm.  On 11/22 at 1518 hours, he reports a small minor collapse. Patient is sitting on couch, he stands her up and she remains standing, he asks if she hears crickets. Electrographically, there were no EEG or EKG changes seen.  On 11/23 at 1804 hours, husband reports crickets while brushing teeth. Patient not on video. Electrographically, there were no EEG or EKG changes seen.  On 11/23 at 1946 hours, husband reports Event #1. Patient not on video. There is a change from baseline with diffuse theta and delta slowing seen lasting 28 seconds then husband pushes event button. No EKG changes seen.   On 11/23 at 1947 hours, husband reports Event #2 going to bed. Patient not on video. There is a change from baseline with diffuse theta and delta slowing seen lasting 38 seconds followed by return to baseline. No EKG changes seen.   On 11/23 at 1953 hours, husband reports Event #3 but did not push button. Patient not on video. Around this time period, there is an 8-second run of diffuse theta and delta slowing. EKG lead obscured by artifact.  On 11/23 at 0800 hours, husband reports Event # 4 but did not push button. Patient not on video. Electrographically, there were no EEG or EKG changes seen around this time.  There were no electrographic seizures seen.  EKG lead was unremarkable.  IMPRESSION: This 48-hour ambulatory video EEG study is abnormal due to the presence of diffuse background  slowing. Episodes of decreased responsiveness did not show epileptiform correlate, 8 episodes were captured, 4 did not show any EEG changes. The other 4 showed diffuse background slowing during events.  CLINICAL CORRELATION of  the above findings indicates diffuse cerebral dysfunction that is nonspecific in etiology and may be seen with hypoxic/ischemic injury, toxic/metabolic encephalopathy, medication effect, or with neurodegenerative conditions. Episodes where diffuse slowing is seen have occurred with positional changes, likely due to hypoperfusion. No epileptiform discharges were seen. If further clinical questions remain, inpatient video EEG monitoring may be helpful.   Ellouise Newer, M.D.

## 2020-03-04 ENCOUNTER — Telehealth: Payer: Self-pay

## 2020-03-04 DIAGNOSIS — Z9181 History of falling: Secondary | ICD-10-CM | POA: Diagnosis not present

## 2020-03-04 DIAGNOSIS — G20 Parkinson's disease: Secondary | ICD-10-CM | POA: Diagnosis not present

## 2020-03-04 NOTE — Telephone Encounter (Signed)
Spoke with pt husband and informed him that we are following up on what is going on with Kellie Moore's suction machine. He stated that he was thankful for [place] staying on top of that and for [place] to call with updates. He also stated that Kellie Moore has had many falls where her BP has dropped very very low and that her BP will drop low while she is in the Bed. Kellie Moore is going to call Pt PCP to see about getting her an appointment so they can evaluate her,

## 2020-03-06 DIAGNOSIS — F32A Depression, unspecified: Secondary | ICD-10-CM | POA: Diagnosis not present

## 2020-03-06 DIAGNOSIS — F419 Anxiety disorder, unspecified: Secondary | ICD-10-CM | POA: Diagnosis not present

## 2020-03-06 DIAGNOSIS — Z853 Personal history of malignant neoplasm of breast: Secondary | ICD-10-CM | POA: Diagnosis not present

## 2020-03-06 DIAGNOSIS — G20 Parkinson's disease: Secondary | ICD-10-CM | POA: Diagnosis not present

## 2020-03-06 DIAGNOSIS — R1312 Dysphagia, oropharyngeal phase: Secondary | ICD-10-CM | POA: Diagnosis not present

## 2020-03-06 DIAGNOSIS — Z8673 Personal history of transient ischemic attack (TIA), and cerebral infarction without residual deficits: Secondary | ICD-10-CM | POA: Diagnosis not present

## 2020-03-06 DIAGNOSIS — F0390 Unspecified dementia without behavioral disturbance: Secondary | ICD-10-CM | POA: Diagnosis not present

## 2020-03-06 DIAGNOSIS — F0280 Dementia in other diseases classified elsewhere without behavioral disturbance: Secondary | ICD-10-CM | POA: Diagnosis not present

## 2020-03-06 DIAGNOSIS — R32 Unspecified urinary incontinence: Secondary | ICD-10-CM | POA: Diagnosis not present

## 2020-03-06 DIAGNOSIS — D649 Anemia, unspecified: Secondary | ICD-10-CM | POA: Diagnosis not present

## 2020-03-06 DIAGNOSIS — Z9181 History of falling: Secondary | ICD-10-CM | POA: Diagnosis not present

## 2020-03-10 ENCOUNTER — Other Ambulatory Visit: Payer: Self-pay | Admitting: Internal Medicine

## 2020-03-10 DIAGNOSIS — G3183 Dementia with Lewy bodies: Secondary | ICD-10-CM | POA: Diagnosis not present

## 2020-03-10 DIAGNOSIS — R1310 Dysphagia, unspecified: Secondary | ICD-10-CM

## 2020-03-10 DIAGNOSIS — I951 Orthostatic hypotension: Secondary | ICD-10-CM | POA: Diagnosis not present

## 2020-03-10 DIAGNOSIS — G20 Parkinson's disease: Secondary | ICD-10-CM | POA: Diagnosis not present

## 2020-03-10 DIAGNOSIS — E43 Unspecified severe protein-calorie malnutrition: Secondary | ICD-10-CM | POA: Diagnosis not present

## 2020-03-10 DIAGNOSIS — K219 Gastro-esophageal reflux disease without esophagitis: Secondary | ICD-10-CM | POA: Diagnosis not present

## 2020-03-12 DIAGNOSIS — G20 Parkinson's disease: Secondary | ICD-10-CM | POA: Diagnosis not present

## 2020-03-12 DIAGNOSIS — R269 Unspecified abnormalities of gait and mobility: Secondary | ICD-10-CM | POA: Diagnosis not present

## 2020-03-13 ENCOUNTER — Other Ambulatory Visit: Payer: PPO

## 2020-03-17 ENCOUNTER — Other Ambulatory Visit: Payer: PPO

## 2020-03-20 ENCOUNTER — Other Ambulatory Visit: Payer: PPO

## 2020-03-25 ENCOUNTER — Ambulatory Visit
Admission: RE | Admit: 2020-03-25 | Discharge: 2020-03-25 | Disposition: A | Discharge status: Home / Self Care | Payer: PPO | Source: Ambulatory Visit | Attending: Internal Medicine | Admitting: Internal Medicine

## 2020-03-25 DIAGNOSIS — R1310 Dysphagia, unspecified: Secondary | ICD-10-CM | POA: Diagnosis not present

## 2020-03-25 DIAGNOSIS — R109 Unspecified abdominal pain: Secondary | ICD-10-CM | POA: Diagnosis not present

## 2020-03-26 ENCOUNTER — Encounter: Payer: Self-pay | Admitting: Neurology

## 2020-03-26 DIAGNOSIS — K117 Disturbances of salivary secretion: Secondary | ICD-10-CM | POA: Diagnosis not present

## 2020-03-26 DIAGNOSIS — G3183 Dementia with Lewy bodies: Secondary | ICD-10-CM | POA: Diagnosis not present

## 2020-03-26 DIAGNOSIS — G20 Parkinson's disease: Secondary | ICD-10-CM | POA: Diagnosis not present

## 2020-03-26 NOTE — Progress Notes (Addendum)
Received Approval for the PA for Myobloc valid from 03/24/20 to 06/22/20; Auth #: [id number]; scanned into chart.   2/1- gave verbal for the script again since it had been so long from last script. ORDER #: [id number] for any issues.

## 2020-03-27 ENCOUNTER — Ambulatory Visit: Payer: PPO | Admitting: Neurology

## 2020-03-27 ENCOUNTER — Encounter: Payer: Self-pay | Admitting: Neurology

## 2020-03-27 ENCOUNTER — Other Ambulatory Visit: Payer: Self-pay

## 2020-03-27 VITALS — BP 94/59 | HR 70 | Ht 67.0 in | Wt 94.2 lb

## 2020-03-27 DIAGNOSIS — G3183 Dementia with Lewy bodies: Secondary | ICD-10-CM | POA: Diagnosis not present

## 2020-03-27 DIAGNOSIS — F419 Anxiety disorder, unspecified: Secondary | ICD-10-CM

## 2020-03-27 DIAGNOSIS — F0281 Dementia in other diseases classified elsewhere with behavioral disturbance: Secondary | ICD-10-CM

## 2020-03-27 MED ORDER — TRAZODONE HCL 50 MG PO TABS: ORAL_TABLET | ORAL | 11 refills | Status: AC

## 2020-03-27 MED ORDER — ESCITALOPRAM OXALATE 5 MG PO TABS: ORAL_TABLET | ORAL | 11 refills | Status: AC

## 2020-03-27 NOTE — Progress Notes (Signed)
NEUROLOGY FOLLOW UP OFFICE NOTE  Kellie Moore [id number] 20-Aug-1950  HISTORY OF PRESENT ILLNESS: I had the pleasure of seeing Kellie Moore in follow-up in the neurology clinic on 03/27/2020.  The patient was last seen 6 weeks ago. She is again accompanied by her husband who helps supplement the history today.  Records and images were personally reviewed where available.  She was started on Lexapro 5mg  daily for anxiety, her husband thinks this has helped. Her anxiety is a little less at night, but she still gets very anxious at noon. She continues to have frequent episodes of falls, he states she would fall daily if he was not behind her. Last fall was yesterday morning. She swears she does not fall. He does not think it is her blood pressure dropping, he has checked BP when she was down and it was fine. She has knee high compression stocking and does not like the abdominal binder. She is on midodrine every 4 hours. She continues to have delusions and thinks there is more than one of him, that there are 3 of him. She talks about memories from growing up, talking about her parents, but no clear visual hallucinations. He reports that symptoms continue to fluctuate, she is in such great shape sometimes. Today is a good day. He has found that rubbing lemon sticks in the salivary glands helps stop drooling for 30 minutes, enough to get her to swallow pills and eat. She is scheduled for Botox soon to hopefully help with sialorrhea. She continues on Donepezil 10mg  daily, Sinemet 25/100mg  1 tab TID, and Trazodone 50mg  qhs, no side effects.   History on Initial Assessment 08/09/2017: This is a pleasant 70 yo RH woman with a history of breast cancer s/p lumpectomy and radiation, in her usual state of health until 2 years ago when she started having changes in memory and personality. She feels her memory is pretty good, she has noticed some changes every once in a while with trouble getting her words out. Her husband  has noticed similar changes, she would say the wrong word. He has also noted a change in behavior with significant anxiety. He feels changes may have started 10 years ago when she was diagnosed with possible breast cancer. Since then she has been hypervigilant about her health. She changed her diet and stopped eating red meat and sugar. She has lost 40 lbs in the past 10 years. She states that her mother had 3 bouts of breast cancer, up to 10 years apart. She has no prior history of significant anxiety, but now feels like she will climb out her skin. She feels like "the bodysnatchers got me, it's not me." She had a prescription for Xanax but has not taken it. Her husband reports that she used to be very active with several projects ongoing at the same time, but in the past 2 years, she has become more lethargic, just wanting to lay around the house. She used to exercise on the treadmill regularly. She states that she has significant fatigue. She used to cook a lot, but now her husband does the cooking. They share bill responsibilities, no missed bills but her husband reports she would now pay at the last moment, which is not like her. He states she drives him crazy with questions all the time on a day to day basis. No driving concerns, but she does not feel comfortable, especially when her hand shakes. She and her husband started noticing hand tremors over  the past 2 years, she feels they are mild and intermittent, he has noticed occasional times where tremor is more pronounced. She feels the shaking is inside. It does not affect eating, but her handwriting has changed. Her husband reports she used to have big, beautiful handwriting, now she has to concentrate to write and it has become smaller. She has also noticed drooling on the left side of her mouth, which is very annoying for her. Sleep feels her sleep is good, no REM behavior disorder, but her husband reports loud snoring and apnea.  She has occasional  slight tingling in her right hand. No headaches, dizziness, diplopia, dysarthria/dysphagia, neck/back pain, bowel/bladder dysfunction. No changes in gait or frequent falls. She feels all this first happened when she broke out in hives, she was tested for allergies and found allergic to dust mite and mold. They found mold in their house around a year ago and had the flooring changed, so husband reports it is not an issue any longer. Her maternal uncle had tremors. She states her father had a cerebral hemorrhage, but her husband also reports that her father had similar lethargy and had an MRI which did not find anything, but on autopsy was found to have a brain tumor. She denies any significant head injuries. She occasionally drinks alcohol, and gets the "[employer]" when she drinks.  Update 12/16/2019: Since her last visit, she has been admitted to the hospital twice for episodes of unresponsiveness Records were reviewed. On 7/26, her husband found her unresponsive, not following commands. She was evaluated by Neurology with an NIHSS of 13, non-focal, felt due to underlying neurodegenerative disease. She significantly improved the next day, symptoms felt due to cognitive fluctuation from PD. Her EEG was normal. MRI brain no acute changes, with mildly advanced diffuse atrophy. She was discharged with home health. She was back in the hospital on 9/13 again unresponsive. Repeat EEG normal, head CT unremarkable. Her husband talked to other people and felt that these episodes were due to the atropine drops he puts under her tongue for sialorrhea. He stopped if last month. She had another milder episode 3 days ago. She did fine with physical therapy that morning, she did PT exercise and walked around and was told she did not need PT any longer. Three hours later, she took her walker then had a funny look on her face. Her hands were shaky, her head went down and she slumped and collapsed on the floor. He stretched her  out, checked her pulse and breathing, then she looked at him and asked what was going on. He stood her up then her legs got so weak she could not stand up. When he got her up, BP was 90/60. On average she runs 120/70. She takes Midodrine BID (breakfast and lunch). She has the funny look on her face 2-3 times a day, even when supine. Her husband has several concerns. Her drooling has become significantly worse, "like molasses coming out." The sundowning, hallucinations and delusions have increased significantly, she thinks her parents are with her and there are 3 of her husband. She wants to go home every night. She confuses dreams with reality, no REM behavior disorder. She is beginning to have bladder incontinence and wears Depends. She complains of chills all day even if it is 74 degrees at home. She has sleep difficulties, he gave her a left over Valium 3 months ago and she slept like a baby. She is on mirtazapine 30mg  qhs, Sinemet  25/100mg  TID, and Donepezil 10mg  daily. On good days, she walks up and down the house really well. Her sense of humor is still pretty good.    Laboratory Data: TSH and B12 were done at PCP office, results unavailable for review MRI brain with and without contrast done 08/15/17 did not show any acute changes. 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 She had several episodes where she was less responsive and collapsed. During some of them, there was diffuse slowing seen, however some events had no EEG changes. Discussed that these EEG changes may be due to hypoperfusion, advised husband to increase hydration and monitor BP.   PAST MEDICAL HISTORY: Past Medical History:  Diagnosis Date  . Cancer [facility])    Breast cancer  . Parkinson's disease ([place])   . 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 bisacodyl (BISACODYL) 5 MG EC tablet Take 5 mg by mouth at bedtime as needed for mild constipation or moderate constipation.    . carbidopa-levodopa (SINEMET IR) 25-100 MG tablet Take 1 tablet by mouth 3 (three) times daily.    Marland Kitchen donepezil (ARICEPT) 10 MG tablet Take 1/2 tablet daily for 2 weeks, then increase to 1 tablet daily (Patient taking differently: Take 10 mg by mouth in the morning.) 30 tablet 11  . Elastic Bandages & Supports (ABDOMINAL BINDER/ELASTIC SMALL) MISC Use during the day 1 each 0  . escitalopram (LEXAPRO) 5 MG tablet Take 1 tablet (5 mg total) by mouth daily. 30 tablet 11  . Ferrous Sulfate (IRON PO) Take by mouth daily.    . Magnesium Bisglycinate (MAG GLYCINATE PO) Take by mouth.    . midodrine (PROAMATINE) 5 MG tablet     . oxymetazoline (AFRIN) 0.05 % nasal spray Place 1 spray into both nostrils 2 (two) times daily as needed for congestion.    . traZODone (DESYREL) 50 MG tablet Take 1/2 tablet every night for 1 week, then increase to 1 tablet every night 30 tablet 11   No current facility-administered medications on file prior to visit.    ALLERGIES: Allergies  Allergen Reactions  . Other Itching and Other (See Comments)    Dust and pollen = Itchy eyes and runny nose    FAMILY HISTORY: Family History  Problem Relation Age of Onset  . Brain cancer Father     SOCIAL HISTORY: Social History   Socioeconomic History  . Marital status: Married    Spouse name: Not on file  . Number of children: Not on file  . Years of education: Not on file  . Highest education level: Not on file  Occupational History  . Occupation: retired  Tobacco Use  . Smoking status: Never Smoker  . Smokeless tobacco: Never Used  Vaping Use  . Vaping Use: Never used  Substance and Sexual Activity  . Alcohol use: Not Currently    Comment: margarita's on fridays  . Drug use: Never  . Sexual activity: Not on file  Other Topics Concern  .  Not on file  Social History Narrative   Lives with husband two story home      Right handed      Masters degree      Retired from [employer] and then [employer] at [place]  Financial Resource Strain: Not on file  Food Insecurity: Not on file  Transportation Needs: Not on file  Physical Activity: Not on file  Stress: Not on file  Social Connections: Not on file  Intimate Partner Violence: Not on file     PHYSICAL EXAM: Vitals:   03/27/20 1435  BP: (!) 94/59  Pulse: 70  SpO2: 99%   General: No acute distress, frail Head:  Normocephalic/atraumatic Skin/Extremities: No rash, no edema Neurological Exam: alert and awake. No aphasia or dysarthria. Fund of knowledge is reduced.  Recent and remote memory are impaired. Attention and concentration are reduced. Cranial nerves: Pupils equal, round. Extraocular movements intact with no nystagmus. Visual fields full.  No facial asymmetry.  Motor: +cogwheeling, muscle strength 5/5 throughout with no pronator drift.   Finger to nose testing intact.  Gait slow and cautious with walker, no ataxia.   IMPRESSION: This is a pleasant 70 yo RH woman with a history of breast cancer s/p lumpectomy and radiation, who presented with a 2 year history of tremors, cognitive, and personality changes. Her DATscan in 2019 showed decreased striatal activity on left and right in a pattern consistent with Parkinson's syndrome pathology, greatest deficit in the posterior right putamen. She also has delusions>hallucinations. Symptoms likely due to Lewy body dementia. She has episodes of fluctuating cognition, prolonged EEG did not show any seizure activity, there was diffuse slowing during some of these episodes, concerning for hypoperfusion/hypotension. She is on Midodrine, advised to start using abdominal binder and thigh high compression stockings. Increase Lexapro to 5mg  BID for anxiety (husband would like to try BID dosing). Proceed  with Botox to hopefully help with sialorrhea. Continue Sinemet 25/100mg  TID and Donepezil 10mg  daily. Follow-up in 3-4 months, they know to call for any changes.   Thank you for allowing me to participate in her care.  Please do not hesitate to call for any questions or concerns.   Ellouise Newer, M.D.   CC: Dr. Lysle Rubens

## 2020-03-27 NOTE — Patient Instructions (Addendum)
1. Increase Lexapro 5mg : take 1 tablet twice a day  2. Continue all your medications  3. Proceed with Botox as scheduled  4. Continue with increasing fluid intake. Start using thigh-high compression stockings  5. Follow-up in 3- 4 months, call for any changes

## 2020-04-04 DIAGNOSIS — Z9181 History of falling: Secondary | ICD-10-CM | POA: Diagnosis not present

## 2020-04-04 DIAGNOSIS — G20 Parkinson's disease: Secondary | ICD-10-CM | POA: Diagnosis not present

## 2020-04-06 DIAGNOSIS — Z853 Personal history of malignant neoplasm of breast: Secondary | ICD-10-CM | POA: Diagnosis not present

## 2020-04-06 DIAGNOSIS — R1312 Dysphagia, oropharyngeal phase: Secondary | ICD-10-CM | POA: Diagnosis not present

## 2020-04-06 DIAGNOSIS — D649 Anemia, unspecified: Secondary | ICD-10-CM | POA: Diagnosis not present

## 2020-04-06 DIAGNOSIS — R32 Unspecified urinary incontinence: Secondary | ICD-10-CM | POA: Diagnosis not present

## 2020-04-06 DIAGNOSIS — F0390 Unspecified dementia without behavioral disturbance: Secondary | ICD-10-CM | POA: Diagnosis not present

## 2020-04-06 DIAGNOSIS — F419 Anxiety disorder, unspecified: Secondary | ICD-10-CM | POA: Diagnosis not present

## 2020-04-06 DIAGNOSIS — G20 Parkinson's disease: Secondary | ICD-10-CM | POA: Diagnosis not present

## 2020-04-06 DIAGNOSIS — Z9181 History of falling: Secondary | ICD-10-CM | POA: Diagnosis not present

## 2020-04-06 DIAGNOSIS — F32A Depression, unspecified: Secondary | ICD-10-CM | POA: Diagnosis not present

## 2020-04-06 DIAGNOSIS — F0280 Dementia in other diseases classified elsewhere without behavioral disturbance: Secondary | ICD-10-CM | POA: Diagnosis not present

## 2020-04-06 DIAGNOSIS — Z8673 Personal history of transient ischemic attack (TIA), and cerebral infarction without residual deficits: Secondary | ICD-10-CM | POA: Diagnosis not present

## 2020-04-10 ENCOUNTER — Other Ambulatory Visit: Payer: Self-pay

## 2020-04-10 ENCOUNTER — Ambulatory Visit (INDEPENDENT_AMBULATORY_CARE_PROVIDER_SITE_OTHER): Payer: Medicare Other | Admitting: Neurology

## 2020-04-10 ENCOUNTER — Ambulatory Visit: Payer: PPO | Admitting: Neurology

## 2020-04-10 DIAGNOSIS — K117 Disturbances of salivary secretion: Secondary | ICD-10-CM | POA: Diagnosis not present

## 2020-04-10 MED ORDER — RIMABOTULINUMTOXINB 5000 UNIT/ML IM SOLN
5000.0000 [IU] | Freq: Once | INTRAMUSCULAR | Status: AC
  Administered 2020-04-10: 5000 [IU] via INTRAMUSCULAR

## 2020-04-10 NOTE — Procedures (Signed)
Botulinum Clinic    History:  Diagnosis: Sialorrhea    Result History  Onset of effect: n/a Duration of Benefit: n/a Adverse Effects: n/a  Consent obtained:  yes The patient/husband was educated on the botulinum toxin the black blox warning and given a copy of the botox patient medication guide.  The patient understands that this warning states that there have been reported cases of the Botox extending beyond the injection site and creating adverse effects, similar to those of botulism. This included loss of strength, trouble walking, hoarseness, trouble saying words clearly, loss of bladder control, trouble breathing, trouble swallowing, diplopia, blurry vision and ptosis. Most of the distant spread of Botox was happening in patients, primarily children, who received medication for spasticity or for cervical dystonia. The patient expressed understanding and desire to proceed.     Injections  Location Left  Right Units Number of sites  Submandibular gland 250 250 500 1 per side  Parotid 2250 2250 2500 1 per side  TOTAL UNITS:     5000      Type of Toxin: Myobloc type B As ordered and injected IM at today's visit Total Units: 5000  Discarded Units: 0  Needle drawback with each injection was free of blood. Pt tolerated procedure well without complications.   Reinjection is anticipated in 3 months.

## 2020-04-12 DIAGNOSIS — R269 Unspecified abnormalities of gait and mobility: Secondary | ICD-10-CM | POA: Diagnosis not present

## 2020-04-12 DIAGNOSIS — G20 Parkinson's disease: Secondary | ICD-10-CM | POA: Diagnosis not present

## 2020-05-01 ENCOUNTER — Ambulatory Visit: Payer: PPO | Admitting: Neurology

## 2020-05-02 ENCOUNTER — Encounter (HOSPITAL_COMMUNITY): Payer: Self-pay

## 2020-05-02 ENCOUNTER — Other Ambulatory Visit: Payer: Self-pay

## 2020-05-02 ENCOUNTER — Inpatient Hospital Stay (HOSPITAL_COMMUNITY)
Admission: EM | Admit: 2020-05-02 | Discharge: 2020-05-29 | DRG: 296 | Disposition: E | Payer: Medicare Other | Attending: Pulmonary Disease | Admitting: Pulmonary Disease

## 2020-05-02 ENCOUNTER — Inpatient Hospital Stay (HOSPITAL_COMMUNITY): Payer: Medicare Other

## 2020-05-02 ENCOUNTER — Emergency Department (HOSPITAL_COMMUNITY): Payer: Medicare Other

## 2020-05-02 DIAGNOSIS — I469 Cardiac arrest, cause unspecified: Principal | ICD-10-CM

## 2020-05-02 DIAGNOSIS — R404 Transient alteration of awareness: Secondary | ICD-10-CM | POA: Diagnosis not present

## 2020-05-02 DIAGNOSIS — Z95810 Presence of automatic (implantable) cardiac defibrillator: Secondary | ICD-10-CM | POA: Diagnosis not present

## 2020-05-02 DIAGNOSIS — Z66 Do not resuscitate: Secondary | ICD-10-CM | POA: Diagnosis present

## 2020-05-02 DIAGNOSIS — J9601 Acute respiratory failure with hypoxia: Secondary | ICD-10-CM | POA: Diagnosis not present

## 2020-05-02 DIAGNOSIS — R0902 Hypoxemia: Secondary | ICD-10-CM | POA: Diagnosis not present

## 2020-05-02 DIAGNOSIS — U071 COVID-19: Secondary | ICD-10-CM | POA: Diagnosis not present

## 2020-05-02 DIAGNOSIS — R000 Tachycardia, unspecified: Secondary | ICD-10-CM | POA: Diagnosis not present

## 2020-05-02 DIAGNOSIS — J9600 Acute respiratory failure, unspecified whether with hypoxia or hypercapnia: Secondary | ICD-10-CM | POA: Diagnosis present

## 2020-05-02 DIAGNOSIS — Z9181 History of falling: Secondary | ICD-10-CM | POA: Diagnosis not present

## 2020-05-02 DIAGNOSIS — Z4682 Encounter for fitting and adjustment of non-vascular catheter: Secondary | ICD-10-CM | POA: Diagnosis not present

## 2020-05-02 DIAGNOSIS — T50915A Adverse effect of multiple unspecified drugs, medicaments and biological substances, initial encounter: Secondary | ICD-10-CM | POA: Diagnosis present

## 2020-05-02 DIAGNOSIS — F0281 Dementia in other diseases classified elsewhere with behavioral disturbance: Secondary | ICD-10-CM

## 2020-05-02 DIAGNOSIS — R296 Repeated falls: Secondary | ICD-10-CM | POA: Diagnosis present

## 2020-05-02 DIAGNOSIS — Z515 Encounter for palliative care: Secondary | ICD-10-CM | POA: Diagnosis not present

## 2020-05-02 DIAGNOSIS — R443 Hallucinations, unspecified: Secondary | ICD-10-CM | POA: Diagnosis present

## 2020-05-02 DIAGNOSIS — I959 Hypotension, unspecified: Secondary | ICD-10-CM | POA: Diagnosis not present

## 2020-05-02 DIAGNOSIS — G3183 Dementia with Lewy bodies: Secondary | ICD-10-CM | POA: Diagnosis not present

## 2020-05-02 DIAGNOSIS — G931 Anoxic brain damage, not elsewhere classified: Secondary | ICD-10-CM | POA: Diagnosis not present

## 2020-05-02 DIAGNOSIS — R570 Cardiogenic shock: Secondary | ICD-10-CM | POA: Diagnosis present

## 2020-05-02 DIAGNOSIS — Z853 Personal history of malignant neoplasm of breast: Secondary | ICD-10-CM

## 2020-05-02 DIAGNOSIS — G20 Parkinson's disease: Secondary | ICD-10-CM | POA: Diagnosis not present

## 2020-05-02 DIAGNOSIS — R4182 Altered mental status, unspecified: Secondary | ICD-10-CM | POA: Diagnosis not present

## 2020-05-02 DIAGNOSIS — G253 Myoclonus: Secondary | ICD-10-CM | POA: Diagnosis present

## 2020-05-02 DIAGNOSIS — R0689 Other abnormalities of breathing: Secondary | ICD-10-CM | POA: Diagnosis not present

## 2020-05-02 HISTORY — DX: Parkinson's disease: G20

## 2020-05-02 LAB — COMPREHENSIVE METABOLIC PANEL
ALT: 59 U/L — ABNORMAL HIGH (ref 0–44)
AST: 115 U/L — ABNORMAL HIGH (ref 15–41)
Albumin: 3.1 g/dL — ABNORMAL LOW (ref 3.5–5.0)
Alkaline Phosphatase: 55 U/L (ref 38–126)
Anion gap: 17 — ABNORMAL HIGH (ref 5–15)
BUN: 16 mg/dL (ref 8–23)
CO2: 16 mmol/L — ABNORMAL LOW (ref 22–32)
Calcium: 8.5 mg/dL — ABNORMAL LOW (ref 8.9–10.3)
Chloride: 103 mmol/L (ref 98–111)
Creatinine, Ser: 1.12 mg/dL — ABNORMAL HIGH (ref 0.44–1.00)
GFR, Estimated: 52 mL/min — ABNORMAL LOW (ref >60)
Glucose, Bld: 240 mg/dL — ABNORMAL HIGH (ref 70–99)
Potassium: 4.6 mmol/L (ref 3.5–5.1)
Sodium: 136 mmol/L (ref 135–145)
Total Bilirubin: 0.8 mg/dL (ref 0.3–1.2)
Total Protein: 5 g/dL — ABNORMAL LOW (ref 6.5–8.1)

## 2020-05-02 LAB — CBC WITH DIFFERENTIAL/PLATELET
Abs Immature Granulocytes: 0.33 10*3/uL — ABNORMAL HIGH (ref 0.00–0.07)
Basophils Absolute: 0 10*3/uL (ref 0.0–0.1)
Basophils Relative: 1 %
Eosinophils Absolute: 0 10*3/uL (ref 0.0–0.5)
Eosinophils Relative: 1 %
HCT: 36.9 % (ref 36.0–46.0)
Hemoglobin: 11.6 g/dL — ABNORMAL LOW (ref 12.0–15.0)
Immature Granulocytes: 4 %
Lymphocytes Relative: 36 %
Lymphs Abs: 2.9 10*3/uL (ref 0.7–4.0)
MCH: 32.3 pg (ref 26.0–34.0)
MCHC: 31.4 g/dL (ref 30.0–36.0)
MCV: 102.8 fL — ABNORMAL HIGH (ref 80.0–100.0)
Monocytes Absolute: 0.3 10*3/uL (ref 0.1–1.0)
Monocytes Relative: 4 %
Neutro Abs: 4.4 10*3/uL (ref 1.7–7.7)
Neutrophils Relative %: 54 %
Platelets: 261 10*3/uL (ref 150–400)
RBC: 3.59 MIL/uL — ABNORMAL LOW (ref 3.87–5.11)
RDW: 12.4 % (ref 11.5–15.5)
WBC: 8 10*3/uL (ref 4.0–10.5)
nRBC: 0 % (ref 0.0–0.2)

## 2020-05-02 LAB — RESP PANEL BY RT-PCR (FLU A&B, COVID) ARPGX2
Influenza A by PCR: NEGATIVE
Influenza B by PCR: NEGATIVE
SARS Coronavirus 2 by RT PCR: POSITIVE — AB

## 2020-05-02 LAB — GLUCOSE, CAPILLARY: Glucose-Capillary: 154 mg/dL — ABNORMAL HIGH (ref 70–99)

## 2020-05-02 LAB — TROPONIN I (HIGH SENSITIVITY): Troponin I (High Sensitivity): 11 ng/L (ref <18)

## 2020-05-02 LAB — MAGNESIUM: Magnesium: 2.4 mg/dL (ref 1.7–2.4)

## 2020-05-02 MED ORDER — PANTOPRAZOLE SODIUM 40 MG IV SOLR: 40.0000 mg | Freq: Every day | INTRAVENOUS | Status: DC

## 2020-05-02 MED ORDER — POLYETHYLENE GLYCOL 3350 17 G PO PACK: 17.0000 g | PACK | Freq: Every day | ORAL | Status: DC | PRN

## 2020-05-02 MED ORDER — ORAL CARE MOUTH RINSE
15.0000 mL | OROMUCOSAL | Status: DC
  Administered 2020-05-02 – 2020-05-03 (×7): 15 mL via OROMUCOSAL

## 2020-05-02 MED ORDER — ORAL CARE MOUTH RINSE: 15.0000 mL | OROMUCOSAL | Status: DC

## 2020-05-02 MED ORDER — CHLORHEXIDINE GLUCONATE CLOTH 2 % EX PADS
6.0000 | MEDICATED_PAD | Freq: Every day | CUTANEOUS | Status: DC
  Administered 2020-05-02: 6 via TOPICAL

## 2020-05-02 MED ORDER — PANTOPRAZOLE SODIUM 40 MG IV SOLR
40.0000 mg | Freq: Every day | INTRAVENOUS | Status: DC
  Administered 2020-05-02: 40 mg via INTRAVENOUS
  Filled 2020-05-02: qty 40

## 2020-05-02 MED ORDER — SODIUM CHLORIDE 0.9 % IV SOLN
INTRAVENOUS | Status: DC
Start: 2020-05-02 — End: 2020-05-03

## 2020-05-02 MED ORDER — CHLORHEXIDINE GLUCONATE 0.12% ORAL RINSE (MEDLINE KIT)
15.0000 mL | Freq: Two times a day (BID) | OROMUCOSAL | Status: DC
  Administered 2020-05-02 – 2020-05-03 (×2): 15 mL via OROMUCOSAL

## 2020-05-02 MED ORDER — SODIUM CHLORIDE 0.9 % IV BOLUS
1000.0000 mL | Freq: Once | INTRAVENOUS | Status: AC
  Administered 2020-05-02: 1000 mL via INTRAVENOUS

## 2020-05-02 MED ORDER — EPINEPHRINE HCL 5 MG/250ML IV SOLN IN NS: 0.5000 ug/min | INTRAVENOUS | Status: DC

## 2020-05-02 MED ORDER — DOCUSATE SODIUM 100 MG PO CAPS: 100.0000 mg | ORAL_CAPSULE | Freq: Two times a day (BID) | ORAL | Status: DC | PRN

## 2020-05-02 NOTE — ED Notes (Signed)
CCM at bedside talking with husband

## 2020-05-02 NOTE — ED Notes (Signed)
This RN attempted OG x2 without success

## 2020-05-02 NOTE — ED Provider Notes (Signed)
[facility] EMERGENCY DEPARTMENT Provider Note   CSN: [id number] Arrival date & time: 05/28/2020  1346     History No chief complaint on file.   Kellie Moore is a 70 y.o. female.  The history is provided by the EMS personnel, the spouse and medical records. No language interpreter was used.  Cardiac Arrest Witnessed by:  Not witnessed Incident location:  Home Condition upon EMS arrival:  Unresponsive Pulse:  Absent Initial cardiac rhythm per EMS:  PEA Treatments prior to arrival:  ACLS protocol Medications given prior to ED:  Epinephrine Airway:  Intubation prior to arrival Rhythm on admission to ED:  Normal sinus   LVL5 caveat for AMS and intubated post arrest    No past medical history on file.  There are no problems to display for this patient.      OB History   No obstetric history on file.     No family history on file.     Home Medications Prior to Admission medications   Not on File    Allergies    Patient has no allergy information on record.  Review of Systems   Review of Systems  Unable to perform ROS: Patient unresponsive    Physical Exam Updated Vital Signs BP 123/67   Pulse 63   Temp (!) 96 F (35.6 C) (Temporal)   Resp 18   Ht 5\' 4"  (1.626 m)   SpO2 100%   Physical Exam Vitals and nursing note reviewed.  Constitutional:      General: She is not in acute distress.    Appearance: She is ill-appearing. She is not toxic-appearing or diaphoretic.  HENT:     Head: Normocephalic.     Nose: No congestion.     Mouth/Throat:     Mouth: Mucous membranes are dry.     Pharynx: No oropharyngeal exudate or posterior oropharyngeal erythema.  Eyes:     Conjunctiva/sclera: Conjunctivae normal.     Comments: [place] and unresponsive  Cardiovascular:     Rate and Rhythm: Normal rate.     Pulses: Normal pulses.     Heart sounds: No murmur heard.   Pulmonary:     Breath sounds: No rhonchi.  Chest:     Chest wall:  No tenderness.  Abdominal:     Tenderness: There is no abdominal tenderness.  Musculoskeletal:        General: No tenderness.     Cervical back: No tenderness.  Skin:    Findings: No erythema.  Neurological:     Mental Status: She is unresponsive.     GCS: GCS eye subscore is 1. GCS verbal subscore is 1. GCS motor subscore is 1.     ED Results / Procedures / Treatments   Labs (all labs ordered are listed, but only abnormal results are displayed) Labs Reviewed  CBC WITH DIFFERENTIAL/PLATELET - Abnormal; Notable for the following components:      Result Value   RBC 3.59 (*)    Hemoglobin 11.6 (*)    MCV 102.8 (*)    Abs Immature Granulocytes 0.33 (*)    All other components within normal limits  COMPREHENSIVE METABOLIC PANEL - Abnormal; Notable for the following components:   CO2 16 (*)    Glucose, Bld 240 (*)    Creatinine, Ser 1.12 (*)    Calcium 8.5 (*)    Total Protein 5.0 (*)    Albumin 3.1 (*)    AST 115 (*)    ALT  59 (*)    GFR, Estimated 52 (*)    Anion gap 17 (*)    All other components within normal limits  RESP PANEL BY RT-PCR (FLU A&B, COVID) ARPGX2  MAGNESIUM  BLOOD GAS, ARTERIAL  CBC  PROTIME-INR  APTT  TROPONIN I (HIGH SENSITIVITY)  TROPONIN I (HIGH SENSITIVITY)    EKG None    Radiology DG Chest Portable 1 View  Result Date: 05/12/2020 CLINICAL DATA:  Status post intubation. EXAM: PORTABLE CHEST 1 VIEW COMPARISON:  None. FINDINGS: Endotracheal tube is in place with the tip 2.7 cm above the carina. Lungs are clear. Heart size is normal. No pneumothorax or pleural fluid. Defibrillator pads noted. IMPRESSION: ETT is 2.7 cm above the carina. Lungs clear. Electronically Signed   By: Inge Rise M.D.   On: 05/15/2020 14:19    Procedures Procedures   CRITICAL CARE Performed by: Gwenyth Allegra Rollan Roger Total critical care time: 35 minutes Critical care time was exclusive of separately billable procedures and treating other patients. Critical care  was necessary to treat or prevent imminent or life-threatening deterioration. Critical care was time spent personally by me on the following activities: development of treatment plan with patient and/or surrogate as well as nursing, discussions with consultants, evaluation of patient's response to treatment, examination of patient, obtaining history from patient or surrogate, ordering and performing treatments and interventions, ordering and review of laboratory studies, ordering and review of radiographic studies, pulse oximetry and re-evaluation of patient's condition.  Medications Ordered in ED Medications  EPINEPHrine (ADRENALIN) 4 mg in NS 250 mL (0.016 mg/mL) premix infusion (4 mcg/min Intravenous Rate/Dose Change 05/19/2020 1530)  docusate sodium (COLACE) capsule 100 mg (has no administration in time range)  polyethylene glycol (MIRALAX / GLYCOLAX) packet 17 g (has no administration in time range)  pantoprazole (PROTONIX) injection 40 mg (has no administration in time range)  pantoprazole (PROTONIX) injection 40 mg (has no administration in time range)  0.9 %  sodium chloride infusion (has no administration in time range)  sodium chloride 0.9 % bolus 1,000 mL (1,000 mLs Intravenous New Bag/Given 05/21/2020 1358)    ED Course  I have reviewed the triage vital signs and the nursing notes.  Pertinent labs & imaging results that were available during my care of the patient were reviewed by me and considered in my medical decision making (see chart for details).    MDM Rules/Calculators/A&P                          Kellie Moore is a 70 y.o. female with unknown past medical history aside from EMS reporting the patient  is reportedly DNR and a hospice patient as of yesterday who presents as a post arrest.  According to EMS, patient had a witnessed arrest where she fell the ground and lost pulses per family.  Patient reportedly received 8 minutes of BLS CPR including 2 doses of epinephrine prior to  [place].  EMS started CPR when family cannot find DNR paperwork.  They also reportedly tried to contact a physician for discontinuation of resuscitative effort order however, due to radio traffic this was unable to be completed.  As a did not have paperwork, patient was intubated in the field.  Family reportedly wanted to discontinue resuscitative efforts but when they got pulses back, they were compelled to bring patient in for evaluation.  When they got pulses back, patient was hypotensive and they started epi drip.  Patient has  remained intubated and has had pulses ever since.  On arrival, patient's pupils are pinpoint bilaterally.  No evidence of acute trauma on initial exam.  Patient's breath sounds are symmetric and clear initially.  Patient is not responding to pain.  Patient is intubated.  Blood pressure is in the 84Z and 66A systolic.  Until we speak to the family, we will give fluids and get a portable chest x-ray, labs and a head CT.  Anticipate discussion with family to determine management.  3:08 PM I spoke to the husband who was able to arrive and provide further information.  He reports that patient had a downtime of potentially 8 to 10 minutes before he found her slumped over in her walker.  He reports that she was unresponsive and he could not feel pulse so he started CPR.   He confirms that she wanted to be DNR so we will make her DNR.  Patient will be admitted to ICU team for further management.  She will get the labs and imaging however anticipate when family is present and after discussion, she will likely be made full comfort care and they will extubate and turned down the pressors.  Critical care team will assume care for further management of this patient    Final Clinical Impression(s) / ED Diagnoses Final diagnoses:  Cardiac arrest [facility])     Clinical Impression: 1. Cardiac arrest [facility])     Disposition: Admit  This note was prepared with assistance of Dragon  voice recognition software. Occasional wrong-word or sound-a-like substitutions may have occurred due to the inherent limitations of voice recognition software.     Melessa Cowell, Gwenyth Allegra, MD 05/14/2020 [phone]

## 2020-05-02 NOTE — ED Triage Notes (Signed)
Patient arrived by [facility] following fall/unresponsive in field and had no pulses on Fire arrival. Patient received BLS/CPR for 8 minutes prior to pulses return. Per family hospice patient but could not find any DNR paperwork. Patient arrived intubated with Epi infusing at 35mcg. CBG 208

## 2020-05-02 NOTE — ED Notes (Signed)
Husband at bedside

## 2020-05-02 NOTE — H&P (Signed)
NAME:  Kellie Moore, MRN:  [id number], DOB:  Jul 28, 1950, LOS: 0 ADMISSION DATE:  05/07/2020, CONSULTATION DATE:  05/22/2020  REFERRING [id number]  CHIEF COMPLAINT:  Cardiac arrest   Brief History:  70 yo on hospice with witnessed arrest, 8 min downtime prior to ROSC  History of Present Illness:  She has advanced Lewy body dementia please note separate chart #[id number] Reviewed Dr. Amparo Bristol notes -presented 2 years ago with personality changes and Parkinson syndrome, frequent falls, delusions more than hallucinations, side effects of medications.  History also obtained from patient's husband Tom at the bedside She has been struggling over the last week , does not recognize him, falling frequently. Her father was on mechanical ventilation for 30 days and she requested him "not to do this to her".  DNR has been issued in the past.  When she collapsed today all of a sudden, he panicked and called EMS , started CPR .  No pulses on fire arrival, BLS/CPR for 8 minutes before ROSC, epi x2, started on epi drip , intubated in the field since no DNR paperwork was found.  After arrival to the ED, blood pressure has been stable on epinephrine drip Pupils noted to be pinpoint, no evidence of trauma, DNR issued after speaking to husband  Past Medical History:  Breast CA Parkinson's?  Lewy body dementia  Significant Hospital Events:  3/5 admit p-arrest , ROSC x 8 mins  Consults:    Procedures:  ETT 3/5 >>  Significant Diagnostic Tests:  Head CT 3/5 >>  Micro Data:    Antimicrobials:     Interim History / Subjective:    Objective   Blood pressure 123/67, pulse 63, temperature (!) 96 F (35.6 C), temperature source Temporal, resp. rate 18, height 5\' 4"  (1.626 m), SpO2 100 %.    Vent Mode: PRVC FiO2 (%):  [30 %-100 %] 30 % Set Rate:  [18 bmp] 18 bmp Vt Set:  [420 mL] 420 mL PEEP:  [5 cmH20] 5 cmH20 Plateau Pressure:  [0 cmH20] 0 cmH20  No intake or output data in the 24 hours ending  05/15/2020 1535 There were no vitals filed for this visit.  Examination: General: elderly woman, cachectic, intubated, no distress HEENT:  EOMI, sclera anicteric, mild pallor Neck:     No JVD; no thyromegaly Lungs:    BL ventilated BS, no rhonchi CV:         Regular rate and rhythm; no murmurs Abd:      + bowel sounds; soft, non-tender; no palpable masses, no distension Ext:    No edema; adequate peripheral perfusion Skin:      Warm and dry; no rash Neuro: unresponsive, pupils pin point, no corneal or conjunctival reflex , intermittent myoclonic jerks on tactile stimulation   Resolved Hospital Problem list     Assessment & Plan:  Cardiac arrest with ROSC after 8 minutes of CPR, epi x2 Myoclonic jerks suggest anoxic encephalopathy  Cardiogenic shock -continue epinephrine drip -No etiology of cardiac arrest apparent, check troponins  Acute respiratory failure due to cardiac arrest -Vent settings reviewed and adjusted -Chest x-ray shows ET tube in good position, no infiltrates -ABG appears okay  Lewy body dementia -hold meds at this point  Best practice (evaluated daily)  Diet: npo Pain/Anxiety/Delirium protocol (if indicated): fent prn VAP protocol (if indicated): Y DVT prophylaxis: lovenox GI prophylaxis: protonix Glucose control: SSI Mobility: bed rest Disposition:ICU  Goals of Care:  Last date of multidisciplinary goals of care discussion:3/5 Family present:  Husband Tom Summary of discussion: DNR issued.  We discussed her poor quality of life leading to this episode.  We discussed the need for 2 to 3-day window for accurate neuro prognostication but myoclonic jerks are poor prognostic sign.  Regardless of neuro prognosis, her overall declining health suggest that her prognosis would be poor even if by some miracle if she were to be neurologically intact.  She certainly did not want mechanical ventilation.  Agreed to withdraw ventilator support once his family has had a  chance to visit  Code Status: DNR  Labs   CBC: Recent Labs  Lab 05/01/2020 1400  WBC 8.0  NEUTROABS 4.4  HGB 11.6*  HCT 36.9  MCV 102.8*  PLT 762    Basic Metabolic Panel: Recent Labs  Lab 05/11/2020 1400  NA 136  K 4.6  CL 103  CO2 16*  GLUCOSE 240*  BUN 16  CREATININE 1.12*  CALCIUM 8.5*  MG 2.4   GFR: CrCl cannot be calculated (Unknown ideal weight.). Recent Labs  Lab 04/29/2020 1400  WBC 8.0    Liver Function Tests: Recent Labs  Lab 05/14/2020 1400  AST 115*  ALT 59*  ALKPHOS 55  BILITOT 0.8  PROT 5.0*  ALBUMIN 3.1*   No results for input(s): LIPASE, AMYLASE in the last 168 hours. No results for input(s): AMMONIA in the last 168 hours.  ABG No results found for: PHART, PCO2ART, PO2ART, HCO3, TCO2, ACIDBASEDEF, O2SAT   Coagulation Profile: No results for input(s): INR, PROTIME in the last 168 hours.  Cardiac Enzymes: No results for input(s): CKTOTAL, CKMB, CKMBINDEX, TROPONINI in the last 168 hours.  HbA1C: No results found for: HGBA1C  CBG: No results for input(s): GLUCAP in the last 168 hours.  Review of Systems:   Unable to obtain  Past Medical History:  She,  has a past medical history of Parkinson's disease ([place]).   Surgical History:  Lumpectomy  Social History:      Family History:  Her family history is not on file.   Allergies Not on File   Home Medications  Prior to Admission medications   Not on File     Critical care time: 74m    Kara Mead MD. [facility]. [place] [facility] Pager : [phone]  If no response to pager , please call [phone] until 7 pm After 7:00 pm call Elink  [phone]   05/14/2020

## 2020-05-02 NOTE — Progress Notes (Signed)
Patient transported from ED to CT and to room 2V95 without complications.

## 2020-05-03 DIAGNOSIS — I469 Cardiac arrest, cause unspecified: Secondary | ICD-10-CM | POA: Diagnosis not present

## 2020-05-03 DIAGNOSIS — G931 Anoxic brain damage, not elsewhere classified: Secondary | ICD-10-CM

## 2020-05-03 LAB — GLUCOSE, CAPILLARY
Glucose-Capillary: 127 mg/dL — ABNORMAL HIGH (ref 70–99)
Glucose-Capillary: 143 mg/dL — ABNORMAL HIGH (ref 70–99)

## 2020-05-03 MED ORDER — POLYVINYL ALCOHOL 1.4 % OP SOLN
1.0000 [drp] | Freq: Four times a day (QID) | OPHTHALMIC | Status: DC | PRN
Start: 2020-05-03 — End: 2020-05-04
  Filled 2020-05-03: qty 15

## 2020-05-03 MED ORDER — ACETAMINOPHEN 650 MG RE SUPP: 650.0000 mg | Freq: Four times a day (QID) | RECTAL | Status: DC | PRN

## 2020-05-03 MED ORDER — DIPHENHYDRAMINE HCL 50 MG/ML IJ SOLN
25.0000 mg | INTRAMUSCULAR | Status: DC | PRN
  Administered 2020-05-03: 25 mg via INTRAVENOUS
  Filled 2020-05-03: qty 1

## 2020-05-03 MED ORDER — GLYCOPYRROLATE 1 MG PO TABS
1.0000 mg | ORAL_TABLET | ORAL | Status: DC | PRN
  Filled 2020-05-03: qty 1

## 2020-05-03 MED ORDER — GLYCOPYRROLATE 0.2 MG/ML IJ SOLN: 0.2000 mg | INTRAMUSCULAR | Status: DC | PRN

## 2020-05-03 MED ORDER — DEXTROSE 5 % IV SOLN: INTRAVENOUS | Status: DC

## 2020-05-03 MED ORDER — GLYCOPYRROLATE 0.2 MG/ML IJ SOLN
0.2000 mg | INTRAMUSCULAR | Status: DC | PRN
  Administered 2020-05-03: 0.2 mg via INTRAVENOUS
  Filled 2020-05-03: qty 1

## 2020-05-03 MED ORDER — ACETAMINOPHEN 325 MG PO TABS: 650.0000 mg | ORAL_TABLET | Freq: Four times a day (QID) | ORAL | Status: DC | PRN

## 2020-05-03 MED ORDER — MORPHINE 100MG IN NS 100ML (1MG/ML) PREMIX INFUSION
0.0000 mg/h | INTRAVENOUS | Status: DC
  Administered 2020-05-03: 5 mg/h via INTRAVENOUS
  Filled 2020-05-03: qty 100

## 2020-05-03 MED ORDER — MORPHINE SULFATE (PF) 2 MG/ML IV SOLN
2.0000 mg | INTRAVENOUS | Status: DC | PRN
  Administered 2020-05-03: 4 mg via INTRAVENOUS
  Filled 2020-05-03: qty 2

## 2020-05-04 ENCOUNTER — Encounter: Payer: Self-pay | Admitting: Neurology

## 2020-05-04 NOTE — Progress Notes (Signed)
Patient was comfort care patient  Passed away peacefully @ 23:50 on March /07/2020

## 2020-05-04 NOTE — Progress Notes (Signed)
Pt expired at 26, family present in the room.Death pronounced by 2 Rn's and on call Md notified.CDS referral done. Pt is not a candidate for any donation. Morphine 1mg /ml, 65ml wasted with N. Ragaas, Rn.

## 2020-05-06 ENCOUNTER — Telehealth: Payer: Self-pay | Admitting: Neurology

## 2020-05-06 NOTE — Telephone Encounter (Signed)
error 

## 2020-05-10 DIAGNOSIS — G20 Parkinson's disease: Secondary | ICD-10-CM | POA: Diagnosis not present

## 2020-05-10 DIAGNOSIS — R269 Unspecified abnormalities of gait and mobility: Secondary | ICD-10-CM | POA: Diagnosis not present

## 2020-05-29 NOTE — Progress Notes (Signed)
Pt received from [place], comfort care, husband and brother at bedside. Pt appears comfortable. Morphine drip at 5.

## 2020-05-29 NOTE — Progress Notes (Addendum)
NAME:  Kellie Moore, MRN:  [id number], DOB:  [birth year], LOS: 1 ADMISSION DATE:  05/16/2020, CONSULTATION DATE:  31-May-2020  REFERRING DU:[id number]  CHIEF COMPLAINT:  Cardiac arrest   Brief History:  70 yo on hospice for advanced Lewy body dementia with witnessed arrest, 8 min downtime prior to ROSC Surprisingly screening Covid test positive  History of Present Illness:  She has advanced Lewy body dementia please note separate chart #[id number] Reviewed Dr. Amparo Bristol notes -presented 2 years ago with personality changes and Parkinson syndrome, frequent falls, delusions more than hallucinations, side effects of medications.  History also obtained from patient's husband Tom at the bedside She has been struggling over the last week , does not recognize him, falling frequently. Her father was on mechanical ventilation for 30 days and she requested him "not to do this to her".  DNR has been issued in the past.  When she collapsed today all of a sudden, he panicked and called EMS , started CPR .  No pulses on fire arrival, BLS/CPR for 8 minutes before ROSC, epi x2, started on epi drip , intubated in the field since no DNR paperwork was found.  After arrival to the ED, blood pressure has been stable on epinephrine drip Pupils noted to be pinpoint, no evidence of trauma, DNR issued after speaking to husband  Past Medical History:  Breast CA Parkinson's?  Lewy body dementia  Significant Hospital Events:  3/5 admit p-arrest , ROSC x 8 mins  Consults:    Procedures:  ETT 3/5 >>  Significant Diagnostic Tests:  Head CT 3/5 >> neg  Micro Data:  covid pcr >> POS  Antimicrobials:     Interim History / Subjective:   Critically ill, intubated Weaned off epinephrine drip No urine output charted  Objective   Blood pressure (!) 144/68, pulse 78, temperature 99.4 F (37.4 C), temperature source Oral, resp. rate 16, height 5\' 4"  (1.626 m), weight 38.6 kg, SpO2 100 %.    Vent Mode: PRVC FiO2 (%):   [30 %-100 %] 30 % Set Rate:  [16 bmp-18 bmp] 16 bmp Vt Set:  [420 mL-430 mL] 430 mL PEEP:  [5 cmH20] 5 cmH20 Plateau Pressure:  [0 TDV76-16 cmH20] 10 cmH20   Intake/Output Summary (Last 24 hours) at 05/31/2020 1053 Last data filed at 05/31/2020 1000 Gross per 24 hour  Intake 2365.27 ml  Output 200 ml  Net 2165.27 ml   Filed Weights   05/14/2020 1845 05/31/2020 0500  Weight: 42.1 kg 38.6 kg    Examination: General: elderly woman, cachectic, intubated, no distress , eyes half open HEENT:  EOMI, sclera anicteric, mild pallor Neck:     No JVD; no thyromegaly Lungs:    No accessory muscle use, bilateral ventilated breath sounds, no rhonchi, no secretions CV:         Regular rate and rhythm; no murmurs Abd:      + bowel sounds; soft, non-tender; no palpable masses, no distension Ext:    No edema; adequate peripheral perfusion Skin:      Warm and dry; no rash Neuro: Unresponsive, both arms turning lowered, cogwheel rigidity, does not follow commands, involuntary chewing movements, intermittent myoclonus especially to tactile stimulus   Chest x-ray independently reviewed, no infiltrates  Resolved Hospital Problem list     Assessment & Plan:  Cardiac arrest with ROSC after 8 minutes of CPR, epi x2 Myoclonic jerks suggest anoxic encephalopathy  Cardiogenic shock -resolved, off epinephrine drip -No etiology of cardiac arrest apparent, troponin  low  Acute respiratory failure due to cardiac arrest -Vent settings reviewed and adjusted Anoxic encephalopathy -supportive care  Lewy body dementia -hold meds at this point  Covid infection -appears incidental and not contributing to her cardiac arrest  I once again discussed with her husband Tom at the bedside , we discussed her poor quality of life even if she survives this.  We discussed that no prognostication is not accurate at 24 hours but appears to be poor vision on exam today.  She did not want mechanical ventilation and is agreeable  for withdrawal of ventilator.  He would like her brother and sister to be given a chance to visit.  He does not want his son's to visit and see her in this state  Best practice (evaluated daily)  Diet: npo Pain/Anxiety/Delirium protocol (if indicated): fent prn VAP protocol (if indicated): Y DVT prophylaxis: lovenox GI prophylaxis: protonix Glucose control: SSI Mobility: bed rest Disposition:ICU  Goals of Care:  Last date of multidisciplinary goals of care discussion:3/5 Family present: Husband Tom Summary of discussion: DNR issued.   Code Status: DNR  Labs   CBC: Recent Labs  Lab 05/14/2020 1400  WBC 8.0  NEUTROABS 4.4  HGB 11.6*  HCT 36.9  MCV 102.8*  PLT 681    Basic Metabolic Panel: Recent Labs  Lab 05/15/2020 1400  NA 136  K 4.6  CL 103  CO2 16*  GLUCOSE 240*  BUN 16  CREATININE 1.12*  CALCIUM 8.5*  MG 2.4   GFR: Estimated Creatinine Clearance: 27.7 mL/min (A) (by C-G formula based on SCr of 1.12 mg/dL (H)). Recent Labs  Lab 05/01/2020 1400  WBC 8.0    Liver Function Tests: Recent Labs  Lab 05/10/2020 1400  AST 115*  ALT 59*  ALKPHOS 55  BILITOT 0.8  PROT 5.0*  ALBUMIN 3.1*   No results for input(s): LIPASE, AMYLASE in the last 168 hours. No results for input(s): AMMONIA in the last 168 hours.  ABG No results found for: PHART, PCO2ART, PO2ART, HCO3, TCO2, ACIDBASEDEF, O2SAT   Coagulation Profile: No results for input(s): INR, PROTIME in the last 168 hours.  Cardiac Enzymes: No results for input(s): CKTOTAL, CKMB, CKMBINDEX, TROPONINI in the last 168 hours.  HbA1C: No results found for: HGBA1C  CBG: Recent Labs  Lab 04/30/2020 2150 05/07/2020 2356 2020/05/11 0442  GLUCAP 154* 127* 143*    Critical care time: Primera MD. FCCP. [facility] Pager : [phone]  If no response to pager , please call [phone] until 7 pm After 7:00 pm call Elink  [phone]   May 11, 2020

## 2020-05-29 NOTE — Procedures (Signed)
Extubation Procedure Note  Patient Details:   Name: Kellie Moore DOB: [birth year] MRN: [id number]   Airway Documentation:    Vent end date: May 10, 2020 Vent end time: 1138   Pt extubated per comfort care orders.   Vilinda Blanks 05-10-20, 11:40 AM

## 2020-05-29 NOTE — Discharge Summary (Signed)
° °  NAME:  Kellie Moore, MRN:  [id number], DOB:  Jul 14, 1950, LOS: 1 ADMISSION DATE:  05/16/2020, CONSULTATION DATE:  05/11/2020  REFERRING KP:[id number]  CHIEF COMPLAINT:  Cardiac arrest   Brief History:  70 yo on hospice for advanced Lewy body dementia with witnessed arrest, 8 min downtime prior to ROSC Surprisingly screening Covid test positive  History of Present Illness:  She has advanced Lewy body dementia please note separate chart #[id number] Reviewed Dr. Amparo Bristol notes -presented 2 years ago with personality changes and Parkinson syndrome, frequent falls, delusions more than hallucinations, side effects of medications.  History also obtained from patient's husband Tom at the bedside She has been struggling over the last week , does not recognize him, falling frequently. Her father was on mechanical ventilation for 30 days and she requested him "not to do this to her".  DNR has been issued in the past.  When she collapsed today all of a sudden, he panicked and called EMS , started CPR .  No pulses on fire arrival, BLS/CPR for 8 minutes before ROSC, epi x2, started on epi drip , intubated in the field since no DNR paperwork was found.  After arrival to the ED, blood pressure has been stable on epinephrine drip Pupils noted to be pinpoint, no evidence of trauma, DNR issued after speaking to husband  Past Medical History:  Breast CA Parkinson's?  Lewy body dementia  Significant Hospital Events:  3/5 admit p-arrest , ROSC x 8 mins  Consults:    Procedures:  ETT 3/5 >> 3/7  Significant Diagnostic Tests:  Head CT 3/5 >> neg  Micro Data:  covid pcr >> POS  Antimicrobials:    Chest x-ray independently reviewed, no infiltrates  Resolved Hospital Problem list     COURSE :  Cardiac arrest with ROSC after 8 minutes of CPR, epi x2 Myoclonic jerks suggest anoxic encephalopathy  Cardiogenic shock -resolved, off epinephrine drip -No etiology of cardiac arrest apparent, troponin  low  Acute respiratory failure due to cardiac arrest -Mechanical ventilation Anoxic encephalopathy -supportive care  Lewy body dementia -hold meds at this point  Covid infection -appears incidental and not contributing to her cardiac arrest  I once again discussed with her husband Tom at the bedside , we discussed her poor quality of life even if she survives this.  We discussed that neuro prognostication is not accurate at 24 hours but appears to be poor based on exam.  He was agreeable for withdrawal of ventilator.  She was made comfort care and passed away peacefully on morphine drip   Cause of death-Lewy body dementia/Parkinson's, acute respiratory failure  Azhar Yogi V. [place]sworth Soho MD   05/22/2020

## 2020-05-29 DEATH — deceased

## 2020-06-30 ENCOUNTER — Ambulatory Visit: Payer: PPO | Admitting: Neurology

## 2020-07-10 ENCOUNTER — Ambulatory Visit: Payer: PPO | Admitting: Neurology

## 2020-07-29 ENCOUNTER — Ambulatory Visit: Payer: PPO | Admitting: Neurology

## 2021-07-16 IMAGING — DX DG CHEST 1V PORT
1 series · 1 of 1 positions shown · non-contrast
Comparison: CT 06/19/2017

CLINICAL DATA: Altered mental status, catatonic for 2 hours

EXAM:
PORTABLE CHEST 1 VIEW

[chest]
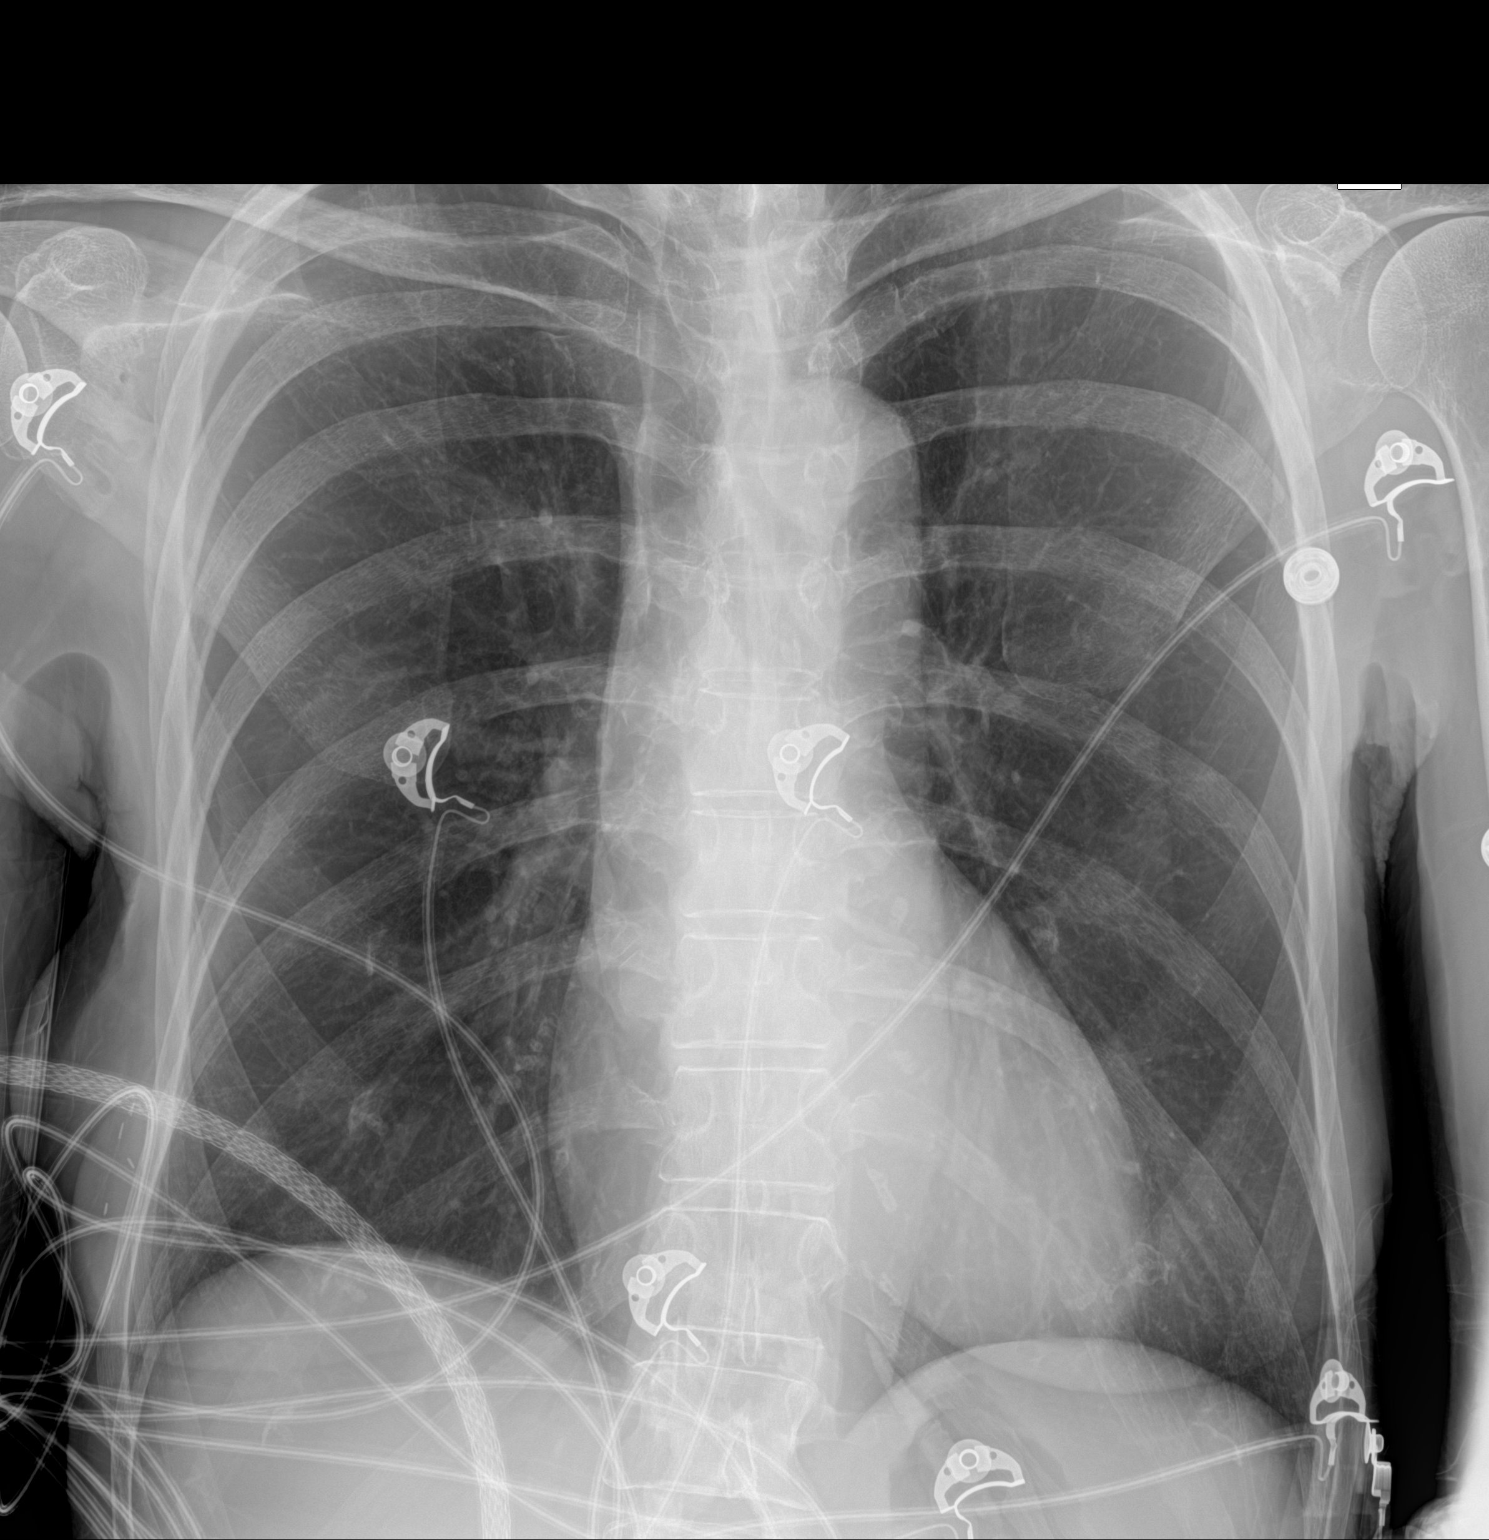

[1 of 1 positions shown; findings below may reference images not displayed]

FINDINGS: Hyperinflation is similar to comparison studies. Few rounded
mineralization seen in the right infrahilar lung, could reflect
stacked broncholith in a patient with a history of prior mucoid
impaction. No other consolidation, features of edema, pneumothorax,
or effusion. Pulmonary vascularity is normally distributed. The
aorta is calcified. The remaining cardiomediastinal contours are
unremarkable. No acute osseous or soft tissue abnormality. Telemetry
leads overlie the chest.
IMPRESSION: A few rounded mineralization in the right infrahilar lung, could
reflect small broncholiths in a patient with a history of prior
mucoid impaction. No acute consolidative opacities.

Chronic hyperinflation.

Aortic Atherosclerosis ([id number]).

## 2021-07-16 IMAGING — CT CT HEAD W/O CM
3 series · 16 of 47 positions shown, 19 images · non-contrast
Comparison: Head CT 09/23/2019

CLINICAL DATA: Encephalopathy. History of Parkinson's disease and
breast carcinoma.

EXAM:
CT HEAD WITHOUT CONTRAST
TECHNIQUE: Contiguous axial images were obtained from the base of the skull
through the vertex without intravenous contrast.

[Series 3: head 5.0 h30s · axial · 0.42mm/px · z∈[-150,-10]mm · 10 of 34 slices shown, 13 images]
[im 3/34  brain]
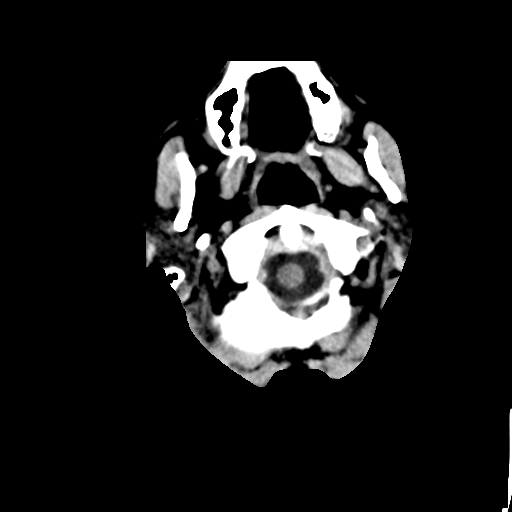
[im 3/34  bone]
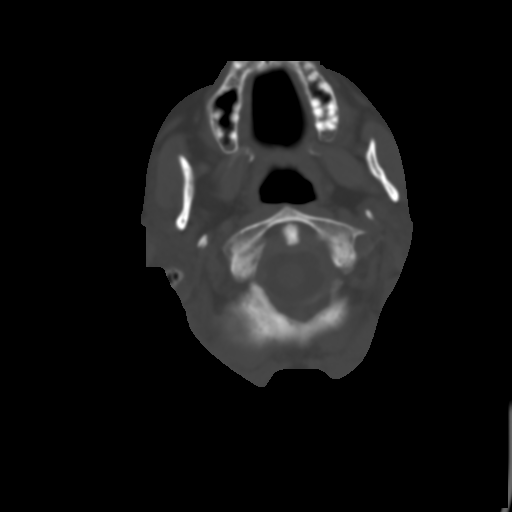
[im 6/34  brain]
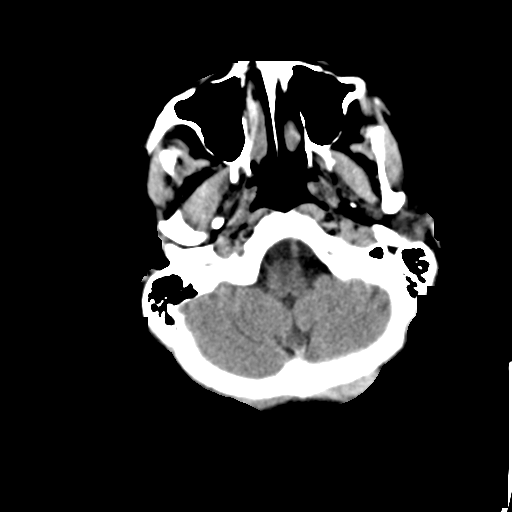
[im 10/34  brain]
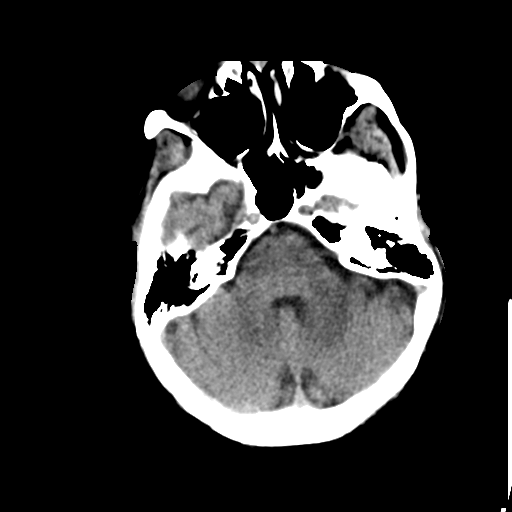
[im 12/34  brain]
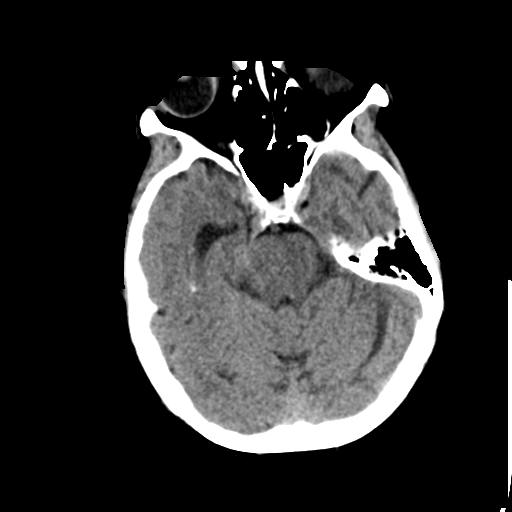
[im 15/34  brain]
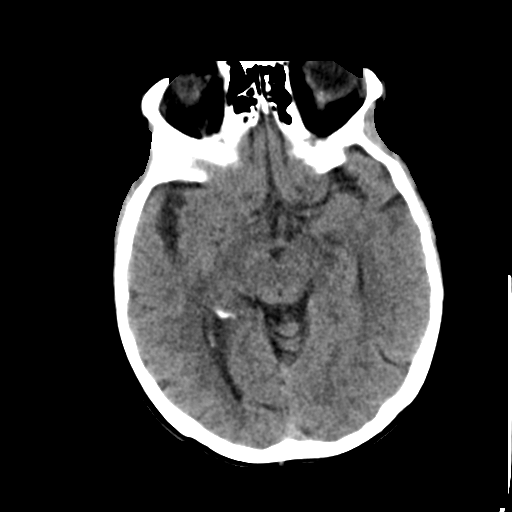
[im 15/34  bone]
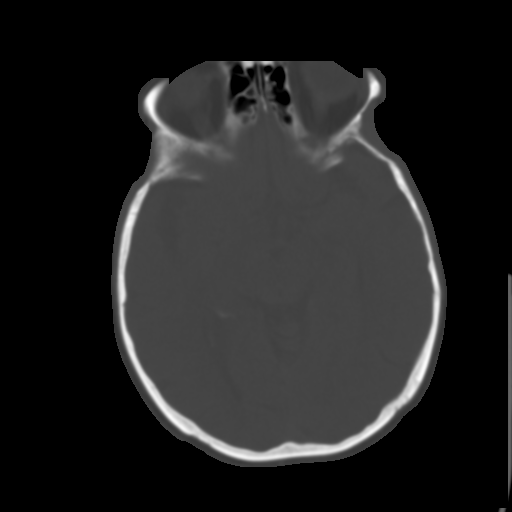
[im 19/34  brain]
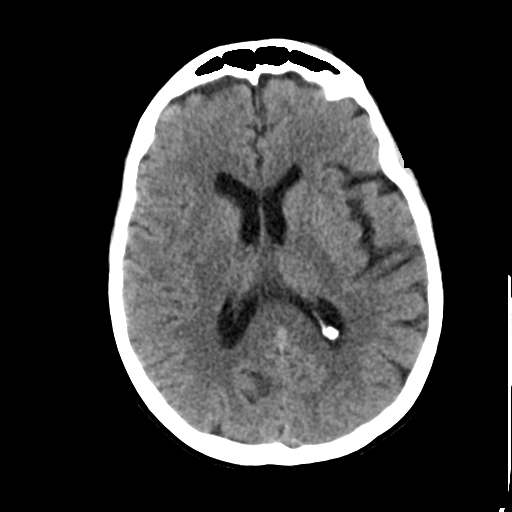
[im 22/34  brain]
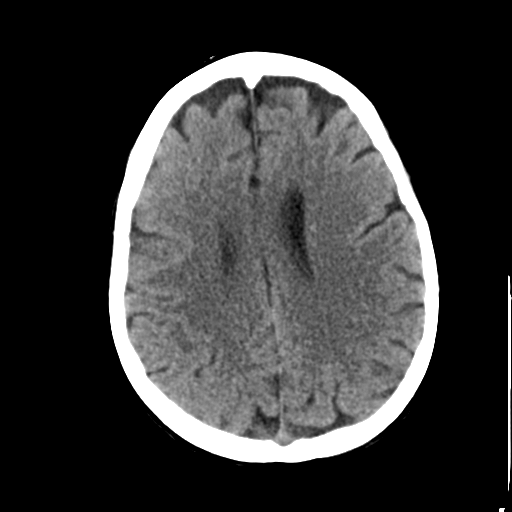
[im 26/34  brain]
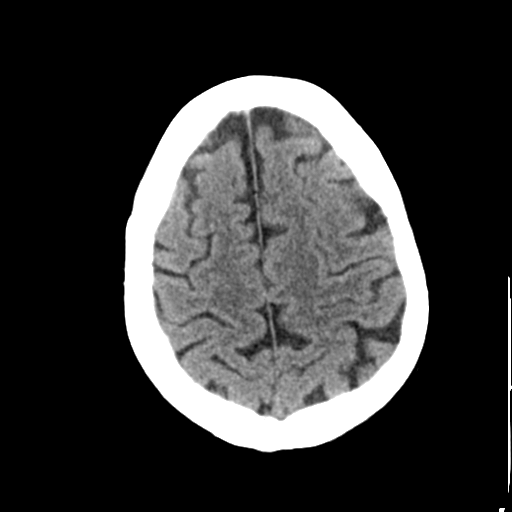
[im 28/34  brain]
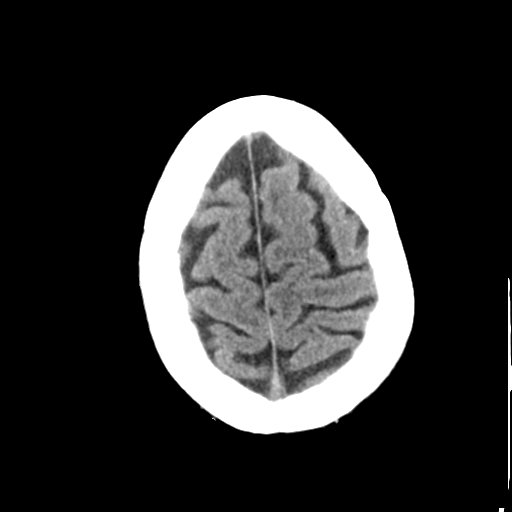
[im 28/34  bone]
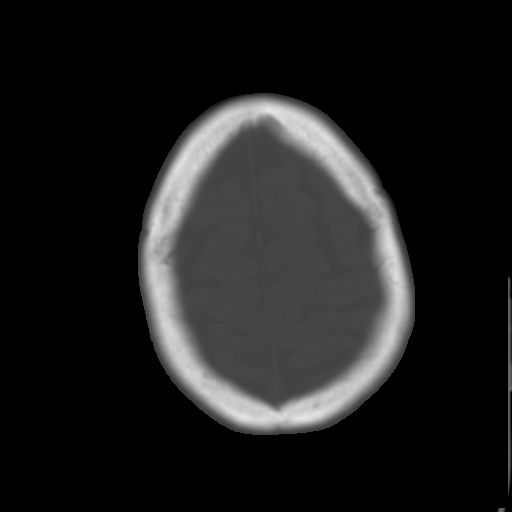
[im 31/34  brain]
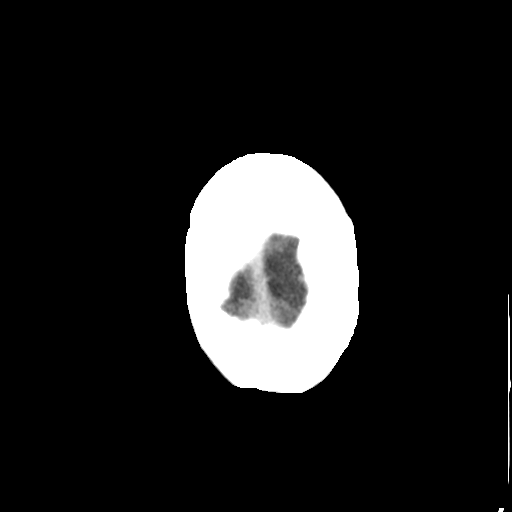

[Series 5: head 3.0 mpr cor · coronal · 0.34mm/px · 3 of 69 slices shown]
[im 23/69  brain]
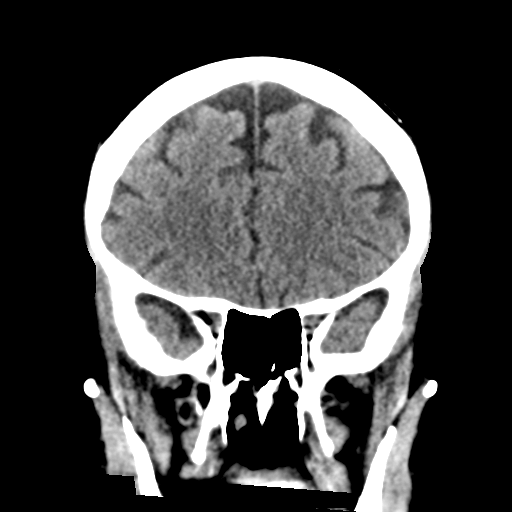
[im 31/69  brain]
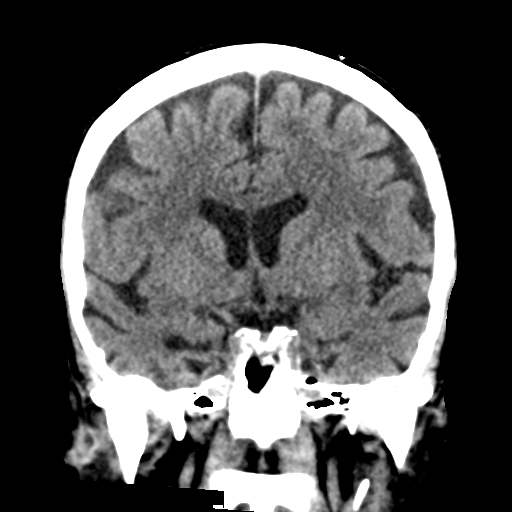
[im 38/69  brain]
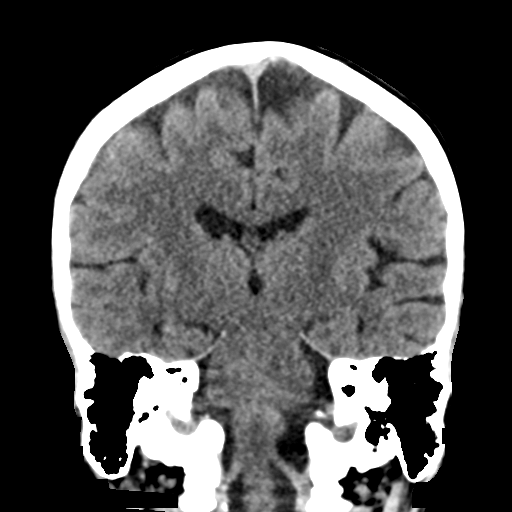

[Series 6: head 3.0 mpr sag · sagittal · 0.37mm/px · 3 of 61 slices shown]
[im 21/61  brain]
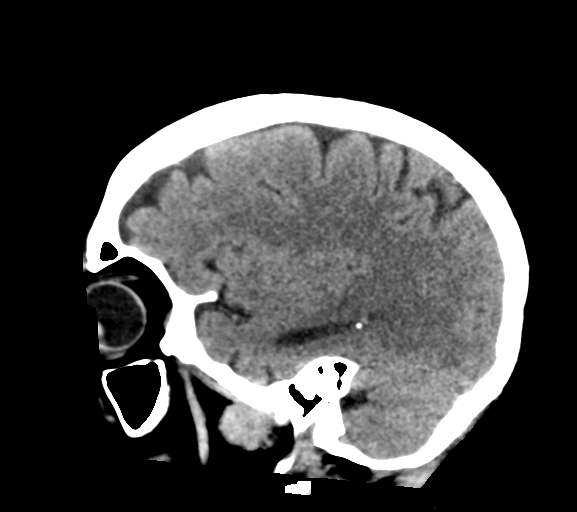
[im 31/61  brain]
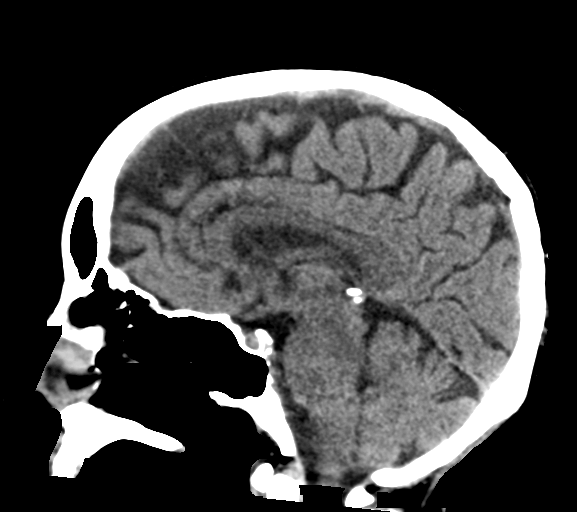
[im 41/61  brain]
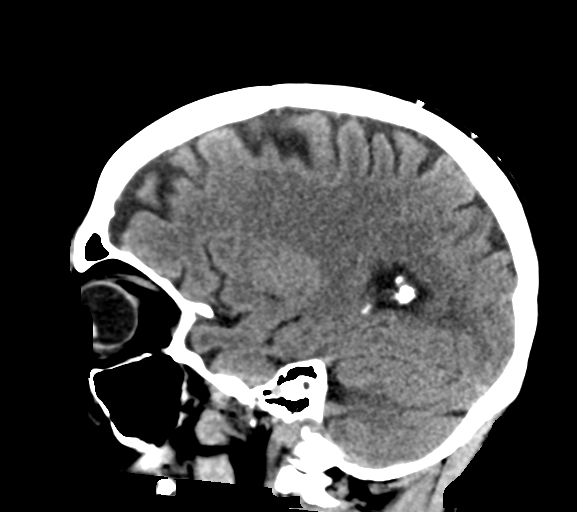

[16 of 47 positions shown; findings below may reference images not displayed]

FINDINGS: Brain: There is no mass, hemorrhage or extra-axial collection. The
size and configuration of the ventricles and extra-axial CSF spaces
are normal. The brain parenchyma is normal, without acute or chronic
infarction.

Vascular: No abnormal hyperdensity of the major intracranial
arteries or dural venous sinuses. No intracranial atherosclerosis.

Skull: The visualized skull base, calvarium and extracranial soft
tissues are normal.

Sinuses/Orbits: No fluid levels or advanced mucosal thickening of
the visualized paranasal sinuses. No mastoid or middle ear effusion.
The orbits are normal.
IMPRESSION: Normal head CT.
